# Patient Record
Sex: Female | Born: 1985 | Race: Black or African American | Hispanic: No | Marital: Single | Smoking: Never smoker
Health system: Southern US, Community
[De-identification: ages and names within clinical notes are randomized; demographics above are authoritative.]

---

## 2021-05-04 ENCOUNTER — Observation Stay (HOSPITAL_COMMUNITY): Payer: Medicaid Other

## 2021-05-04 ENCOUNTER — Emergency Department (HOSPITAL_COMMUNITY): Payer: Medicaid Other

## 2021-05-04 ENCOUNTER — Encounter (HOSPITAL_COMMUNITY): Payer: Self-pay | Admitting: Emergency Medicine

## 2021-05-04 ENCOUNTER — Inpatient Hospital Stay (HOSPITAL_COMMUNITY)
Admission: EM | Admit: 2021-05-04 | Discharge: 2021-05-08 | DRG: 446 | Disposition: A | Discharge status: Home / Self Care | Payer: Medicaid Other | Attending: Internal Medicine | Admitting: Internal Medicine

## 2021-05-04 ENCOUNTER — Other Ambulatory Visit: Payer: Self-pay

## 2021-05-04 DIAGNOSIS — Z20822 Contact with and (suspected) exposure to covid-19: Secondary | ICD-10-CM | POA: Diagnosis present

## 2021-05-04 DIAGNOSIS — R1013 Epigastric pain: Secondary | ICD-10-CM

## 2021-05-04 DIAGNOSIS — K8050 Calculus of bile duct without cholangitis or cholecystitis without obstruction: Secondary | ICD-10-CM

## 2021-05-04 DIAGNOSIS — R1012 Left upper quadrant pain: Secondary | ICD-10-CM

## 2021-05-04 DIAGNOSIS — Z9049 Acquired absence of other specified parts of digestive tract: Secondary | ICD-10-CM

## 2021-05-04 DIAGNOSIS — R109 Unspecified abdominal pain: Secondary | ICD-10-CM | POA: Diagnosis present

## 2021-05-04 DIAGNOSIS — Z6835 Body mass index (BMI) 35.0-35.9, adult: Secondary | ICD-10-CM

## 2021-05-04 DIAGNOSIS — F1210 Cannabis abuse, uncomplicated: Secondary | ICD-10-CM | POA: Diagnosis present

## 2021-05-04 LAB — COMPREHENSIVE METABOLIC PANEL
ALT: 71 U/L — ABNORMAL HIGH (ref 0–44)
AST: 155 U/L — ABNORMAL HIGH (ref 15–41)
Albumin: 4.1 g/dL (ref 3.5–5.0)
Alkaline Phosphatase: 93 U/L (ref 38–126)
Anion gap: 9 (ref 5–15)
BUN: 11 mg/dL (ref 6–20)
CO2: 21 mmol/L — ABNORMAL LOW (ref 22–32)
Calcium: 9.5 mg/dL (ref 8.9–10.3)
Chloride: 109 mmol/L (ref 98–111)
Creatinine, Ser: 0.67 mg/dL (ref 0.44–1.00)
GFR, Estimated: >60 mL/min (ref >60)
Glucose, Bld: 110 mg/dL — ABNORMAL HIGH (ref 70–99)
Potassium: 3.6 mmol/L (ref 3.5–5.1)
Sodium: 139 mmol/L (ref 135–145)
Total Bilirubin: 1.5 mg/dL — ABNORMAL HIGH (ref 0.3–1.2)
Total Protein: 8.1 g/dL (ref 6.5–8.1)

## 2021-05-04 LAB — URINALYSIS, ROUTINE W REFLEX MICROSCOPIC
Bacteria, UA: NONE SEEN
Bilirubin Urine: NEGATIVE
Glucose, UA: NEGATIVE mg/dL
Ketones, ur: NEGATIVE mg/dL
Leukocytes,Ua: NEGATIVE
Nitrite: NEGATIVE
Protein, ur: NEGATIVE mg/dL
Specific Gravity, Urine: 1.013 (ref 1.005–1.030)
pH: 8 (ref 5.0–8.0)

## 2021-05-04 LAB — CBC
HCT: 37.7 % (ref 36.0–46.0)
HCT: 38.5 % (ref 36.0–46.0)
Hemoglobin: 13.2 g/dL (ref 12.0–15.0)
Hemoglobin: 13.2 g/dL (ref 12.0–15.0)
MCH: 29.9 pg (ref 26.0–34.0)
MCH: 30.2 pg (ref 26.0–34.0)
MCHC: 35 g/dL (ref 30.0–36.0)
MCHC: 34.3 g/dL (ref 30.0–36.0)
MCV: 85.5 fL (ref 80.0–100.0)
MCV: 88.1 fL (ref 80.0–100.0)
Platelets: 318 10*3/uL (ref 150–400)
Platelets: 283 10*3/uL (ref 150–400)
RBC: 4.41 MIL/uL (ref 3.87–5.11)
RBC: 4.37 MIL/uL (ref 3.87–5.11)
RDW: 12 % (ref 11.5–15.5)
RDW: 11.9 % (ref 11.5–15.5)
WBC: 8.6 10*3/uL (ref 4.0–10.5)
WBC: 7.6 10*3/uL (ref 4.0–10.5)
nRBC: 0 % (ref 0.0–0.2)
nRBC: 0 % (ref 0.0–0.2)

## 2021-05-04 LAB — PREGNANCY, URINE: Preg Test, Ur: NEGATIVE

## 2021-05-04 LAB — CREATININE, SERUM
Creatinine, Ser: 0.81 mg/dL (ref 0.44–1.00)
GFR, Estimated: >60 mL/min (ref >60)

## 2021-05-04 LAB — HIV ANTIBODY (ROUTINE TESTING W REFLEX): HIV Screen 4th Generation wRfx: NONREACTIVE

## 2021-05-04 LAB — LIPASE, BLOOD: Lipase: 44 U/L (ref 11–51)

## 2021-05-04 LAB — SARS CORONAVIRUS 2 (TAT 6-24 HRS): SARS Coronavirus 2: NEGATIVE

## 2021-05-04 MED ORDER — LIDOCAINE VISCOUS HCL 2 % MT SOLN
15.0000 mL | Freq: Once | OROMUCOSAL | Status: AC
  Administered 2021-05-04: 15 mL via ORAL
  Filled 2021-05-04: qty 15

## 2021-05-04 MED ORDER — MORPHINE SULFATE (PF) 4 MG/ML IV SOLN
4.0000 mg | Freq: Once | INTRAVENOUS | Status: AC
  Administered 2021-05-04: 4 mg via INTRAVENOUS
  Filled 2021-05-04: qty 1

## 2021-05-04 MED ORDER — ACETAMINOPHEN 325 MG PO TABS
650.0000 mg | ORAL_TABLET | Freq: Four times a day (QID) | ORAL | Status: DC | PRN
  Administered 2021-05-06 – 2021-05-08 (×2): 650 mg via ORAL
  Filled 2021-05-04 (×2): qty 2

## 2021-05-04 MED ORDER — IOHEXOL 9 MG/ML PO SOLN
ORAL | Status: AC
  Filled 2021-05-04: qty 1000

## 2021-05-04 MED ORDER — SODIUM CHLORIDE 0.9 % IV SOLN: Freq: Once | INTRAVENOUS | Status: AC

## 2021-05-04 MED ORDER — ACETAMINOPHEN 650 MG RE SUPP: 650.0000 mg | Freq: Four times a day (QID) | RECTAL | Status: DC | PRN

## 2021-05-04 MED ORDER — ALUM & MAG HYDROXIDE-SIMETH 200-200-20 MG/5ML PO SUSP
30.0000 mL | Freq: Once | ORAL | Status: AC
  Administered 2021-05-04: 30 mL via ORAL
  Filled 2021-05-04: qty 30

## 2021-05-04 MED ORDER — OXYCODONE HCL 5 MG PO TABS
5.0000 mg | ORAL_TABLET | ORAL | Status: DC | PRN
  Administered 2021-05-05 – 2021-05-08 (×7): 5 mg via ORAL
  Filled 2021-05-04 (×7): qty 1

## 2021-05-04 MED ORDER — PROCHLORPERAZINE EDISYLATE 10 MG/2ML IJ SOLN
10.0000 mg | Freq: Four times a day (QID) | INTRAMUSCULAR | Status: DC | PRN
  Administered 2021-05-05 – 2021-05-06 (×2): 10 mg via INTRAVENOUS
  Filled 2021-05-04 (×2): qty 2

## 2021-05-04 MED ORDER — IOHEXOL 9 MG/ML PO SOLN: 500.0000 mL | ORAL | Status: AC

## 2021-05-04 MED ORDER — PANTOPRAZOLE SODIUM 40 MG IV SOLR
40.0000 mg | INTRAVENOUS | Status: DC
  Administered 2021-05-04 – 2021-05-06 (×3): 40 mg via INTRAVENOUS
  Filled 2021-05-04 (×3): qty 40

## 2021-05-04 MED ORDER — SODIUM CHLORIDE 0.9 % IV BOLUS
1000.0000 mL | Freq: Once | INTRAVENOUS | Status: AC
  Administered 2021-05-04: 1000 mL via INTRAVENOUS

## 2021-05-04 MED ORDER — MORPHINE SULFATE (PF) 4 MG/ML IV SOLN
4.0000 mg | INTRAVENOUS | Status: DC | PRN
  Administered 2021-05-04 – 2021-05-06 (×9): 4 mg via INTRAVENOUS
  Filled 2021-05-04 (×9): qty 1

## 2021-05-04 MED ORDER — HYOSCYAMINE SULFATE 0.125 MG SL SUBL
0.2500 mg | SUBLINGUAL_TABLET | Freq: Once | SUBLINGUAL | Status: AC
  Administered 2021-05-04: 0.25 mg via SUBLINGUAL
  Filled 2021-05-04: qty 2

## 2021-05-04 MED ORDER — GADOBUTROL 1 MMOL/ML IV SOLN
9.0000 mL | Freq: Once | INTRAVENOUS | Status: AC | PRN
  Administered 2021-05-04: 9 mL via INTRAVENOUS

## 2021-05-04 MED ORDER — METOCLOPRAMIDE HCL 5 MG/ML IJ SOLN
10.0000 mg | Freq: Once | INTRAMUSCULAR | Status: AC
  Administered 2021-05-04: 10 mg via INTRAVENOUS
  Filled 2021-05-04: qty 2

## 2021-05-04 MED ORDER — ENOXAPARIN SODIUM 40 MG/0.4ML IJ SOSY
40.0000 mg | PREFILLED_SYRINGE | INTRAMUSCULAR | Status: DC
  Administered 2021-05-05 – 2021-05-07 (×3): 40 mg via SUBCUTANEOUS
  Filled 2021-05-04 (×4): qty 0.4

## 2021-05-04 NOTE — ED Provider Notes (Signed)
[facility]-EMERGENCY DEPT Provider Note  CSN: [id number] Arrival date & time: 05/04/21 0138  Chief Complaint(s) Abdominal Pain  HPI Jessica Kirk is a 35 y.o. female here with 2 days of gradually worsening epigastric abdominal pain radiating to right upper quadrant and back.  She is endorsing associated nausea and nonbloody nonbilious emesis.  Pain worse with oral intake.  Reports inability to tolerate oral intake.  No alleviating factor.    Reports similar episode in the back approximately 15 years ago when she had gallstones.  Reports having her gallbladder removed in [place].  Denies any suspicious food intake.  No diarrhea.  Still having bowel movements.  Patient is currently on her menstrual cycle.  Denies any urinary symptoms.  Patient denies any daily alcohol use.  Denies any recent alcohol use.  Does admit to smoking marijuana and taking edibles.   HPI  Past Medical History History reviewed. No pertinent past medical history. There are no problems to display for this patient.  Home Medication(s) Prior to Admission medications   Not on File                                                                                                                                    Past Surgical History ** The histories are not reviewed yet. Please review them in the "History" navigator section and refresh this SmartLink. Family History History reviewed. No pertinent family history.  Social History   Allergies Patient has no known allergies.  Review of Systems Review of Systems All other systems are reviewed and are negative for acute change except as noted in the HPI  Physical Exam Vital Signs  I have reviewed the triage vital signs BP 122/86 (BP Location: Right Arm)   Pulse 68   Temp 98.7 F (37.1 C) (Oral)   Resp 18   Ht 5\' 4"  (1.626 m)   Wt 93 kg   SpO2 100%   BMI 35.19 kg/m   Physical Exam Vitals reviewed.  Constitutional:      General:  She is not in acute distress.    Appearance: She is well-developed. She is not diaphoretic.     Comments: uncomfortable  HENT:     Head: Normocephalic and atraumatic.     Right Ear: External ear normal.     Left Ear: External ear normal.     Nose: Nose normal.  Eyes:     General: No scleral icterus.    Conjunctiva/sclera: Conjunctivae normal.  Neck:     Trachea: Phonation normal.  Cardiovascular:     Rate and Rhythm: Normal rate and regular rhythm.  Pulmonary:     Effort: Pulmonary effort is normal. No respiratory distress.     Breath sounds: No stridor.  Abdominal:     General: There is no distension.     Tenderness: There is abdominal tenderness in the epigastric area, periumbilical area and left upper quadrant. There  is no guarding or rebound.  Musculoskeletal:        General: Normal range of motion.     Cervical back: Normal range of motion.  Neurological:     Mental Status: She is alert and oriented to person, place, and time.  Psychiatric:        Behavior: Behavior normal.     ED Results and Treatments Labs (all labs ordered are listed, but only abnormal results are displayed) Labs Reviewed  COMPREHENSIVE METABOLIC PANEL - Abnormal; Notable for the following components:      Result Value   CO2 21 (*)    Glucose, Bld 110 (*)    AST 155 (*)    ALT 71 (*)    Total Bilirubin 1.5 (*)    All other components within normal limits  URINALYSIS, ROUTINE W REFLEX MICROSCOPIC - Abnormal; Notable for the following components:   Hgb urine dipstick MODERATE (*)    All other components within normal limits  SARS CORONAVIRUS 2 (TAT 6-24 HRS)  LIPASE, BLOOD  CBC  PREGNANCY, URINE                                                                                                                         EKG  EKG Interpretation  Date/Time:    Ventricular Rate:    PR Interval:    QRS Duration:   QT Interval:    QTC Calculation:   R Axis:     Text Interpretation:         Radiology US Abdomen Limited RUQ (LIVER/GB)  Result Date: 05/04/2021 CLINICAL DATA:  Right upper quadrant pain EXAM: ULTRASOUND ABDOMEN LIMITED RIGHT UPPER QUADRANT COMPARISON:  None. FINDINGS: Gallbladder: Status post cholecystectomy. Small cystic area within the gallbladder measures 2.7 x 0.7 x 1.3 cm. No free fluid identified within the gallbladder fossa. Common bile duct: Diameter: 13.7 mm Liver: No focal lesion identified. Within normal limits in parenchymal echogenicity. Portal vein is patent on color Doppler imaging with normal direction of blood flow towards the liver. Other: None. IMPRESSION: 1. Dilated common bile duct status post cholecystectomy. If there are clinical signs or symptoms concerning for choledocholithiasis consider further evaluation with MRCP. 2. Small cystic structure within the gallbladder fossa may represent gallbladder or cystic duct remnant status post cholecystectomy. Electronically Signed   By: Signa Kell M.D.   On: 05/04/2021 05:20    Pertinent labs & imaging results that were available during my care of the patient were reviewed by me and considered in my medical decision making (see chart for details).  Medications Ordered in ED Medications  sodium chloride 0.9 % bolus 1,000 mL (0 mLs Intravenous Stopped 05/04/21 0519)  metoCLOPramide (REGLAN) injection 10 mg (10 mg Intravenous Given 05/04/21 0311)  alum & mag hydroxide-simeth (MAALOX/MYLANTA) 200-200-20 MG/5ML suspension 30 mL (30 mLs Oral Given 05/04/21 0311)    And  lidocaine (XYLOCAINE) 2 % viscous mouth solution 15 mL (15 mLs Oral Given 05/04/21 0311)  hyoscyamine (LEVSIN  SL) SL tablet 0.25 mg (0.25 mg Sublingual Given 05/04/21 0311)  morphine 4 MG/ML injection 4 mg (4 mg Intravenous Given 05/04/21 0458)                                                                                                                                    Procedures Procedures  (including critical care time)  Medical  Decision Making / ED Course I have reviewed the nursing notes for this encounter and the patient's prior records (if available in EHR or on provided paperwork).   [name] was evaluated in Emergency Department on 05/04/2021 for the symptoms described in the history of present illness. She was evaluated in the context of the global COVID-19 pandemic, which necessitated consideration that the patient might be at risk for infection with the SARS-CoV-2 virus that causes COVID-19. Institutional protocols and algorithms that pertain to the evaluation of patients at risk for COVID-19 are in a state of rapid change based on information released by regulatory bodies including the CDC and federal and state organizations. These policies and algorithms were followed during the patient's care in the ED.  Patient presents with abdominal pain with nausea and nonbloody nonbilious emesis. Labs grossly reassuring without leukocytosis or anemia.  No significant electrolyte derangements or renal sufficiency. Evidence of bili obstruction.  No pancreatitis. UA without evidence of infection.  Blood is likely from her menstrual cycle.  Doubt renal stone. Low suspicion for serious intra-abdominal inflammatory/infectious process or bowel obstruction requiring imaging at this time.  Patient treated symptomatically with antiemetics, GI cocktail and IV fluids.  RUQ [place] concerning for choledocholithiasis.  On reassessment patient still in pain.  Given a dose of IV morphine.  This did help.  Will call hospitalist for admission to trend LFTs and obtain an MRCP.      Final Clinical Impression(s) / ED Diagnoses Final diagnoses:  Epigastric abdominal pain      This chart was dictated using voice recognition software.  Despite best efforts to proofread,  errors can occur which can change the documentation meaning.   Nira Conn, MD 05/04/21 0700

## 2021-05-04 NOTE — ED Notes (Signed)
Pt given meal tray at this time 

## 2021-05-04 NOTE — H&P (Addendum)
History and Physical    Jessica Kirk ZDG:[id number] DOB: Sep 13, 1986 DOA: 05/04/2021  PCP: Pcp, No  Patient coming from: Home  Chief Complaint: stomach pain  HPI: Jessica Kirk is a 35 y.o. female with medical history significant of arthritis, marijuana use. Presenting with ab pain, N/V. Reports symptoms started 2 days ago with a non-radiating epigastric pain. She is unable to describe the pain, but it has been constant. She does not associate it with any particular activity. She tried gas medicines and laxatives; but they didn't help. She has had N/V to go along with this pain. She had been unable to keep anything down. Her symptoms worsened through last night, so she decided to come to the ED. She denies any other aggravating or alleviating factors.    Of note, her cholecystectomy was performed 15 years ago at [facility] in [place]. Initially it was laproscopic. However, she had some retained/loose stones that required an open revision several days later.    ED Course: She was given a GI cocktail. She had elevated LFTs. RUQ ab US obtained that showed dilated CBD. TRH called for admission.   Review of Systems:  Denies CP, palpitations, dyspnea, HA, diarrhea/constipation, hematochezia, hematemesis, syncopal episodes, fever. Review of systems is otherwise negative for all not mentioned in HPI.   PMHx Arthritis  PSHx Cholecystectomy Carpal tunnel release  SocHx Denies tobacco use Reports EtOH 3 times per month Reports daily mj use: "all day"  No Known Allergies  FamHx History reviewed. No pertinent family history.  Prior to Admission medications   Not on File    Physical Exam: Vitals:   05/04/21 0145 05/04/21 0402 05/04/21 0639  BP: (!) 147/81 (!) 137/92 122/86  Pulse: 84 64 68  Resp: 20 18 18   Temp: 98.4 F (36.9 C)  98.7 F (37.1 C)  TempSrc: Oral  Oral  SpO2: 100% 97% 100%  Weight: 93 kg    Height: 5\' 4"  (1.626 m)      General: 35 y.o. female  resting in bed in NAD Eyes: PERRL, normal sclera ENMT: Nares patent w/o discharge, orophaynx clear, dentition normal, ears w/o discharge/lesions/ulcers Neck: Supple, trachea midline Cardiovascular: RRR, +S1, S2, no m/g/r, equal pulses throughout Respiratory: CTABL, no w/r/r, normal WOB GI: BS+, ND, epigastric/LUQ/LLQ TTP, no masses noted, no organomegaly noted MSK: No e/c/c Skin: No rashes, bruises, ulcerations noted Neuro: A&O x 3, no focal deficits Psyc: Appropriate interaction and affect, calm/cooperative  Labs on Admission: I have personally reviewed following labs and imaging studies  CBC: Recent Labs  Lab 05/04/21 0230  WBC 8.6  HGB 13.2  HCT 37.7  MCV 85.5  PLT 318   Basic Metabolic Panel: Recent Labs  Lab 05/04/21 0230  NA 139  K 3.6  CL 109  CO2 21*  GLUCOSE 110*  BUN 11  CREATININE 0.67  CALCIUM 9.5   GFR: Estimated Creatinine Clearance: 108.5 mL/min (by C-G formula based on SCr of 0.67 mg/dL). Liver Function Tests: Recent Labs  Lab 05/04/21 0230  AST 155*  ALT 71*  ALKPHOS 93  BILITOT 1.5*  PROT 8.1  ALBUMIN 4.1   Recent Labs  Lab 05/04/21 0230  LIPASE 44   No results for input(s): AMMONIA in the last 168 hours. Coagulation Profile: No results for input(s): INR, PROTIME in the last 168 hours. Cardiac Enzymes: No results for input(s): CKTOTAL, CKMB, CKMBINDEX, TROPONINI in the last 168 hours. BNP (last 3 results) No results for input(s): PROBNP in the last 8760 hours. HbA1C:  No results for input(s): HGBA1C in the last 72 hours. CBG: No results for input(s): GLUCAP in the last 168 hours. Lipid Profile: No results for input(s): CHOL, HDL, LDLCALC, TRIG, CHOLHDL, LDLDIRECT in the last 72 hours. Thyroid Function Tests: No results for input(s): TSH, T4TOTAL, FREET4, T3FREE, THYROIDAB in the last 72 hours. Anemia Panel: No results for input(s): VITAMINB12, FOLATE, FERRITIN, TIBC, IRON, RETICCTPCT in the last 72 hours. Urine analysis:     Component Value Date/Time   COLORURINE YELLOW 05/04/2021 0153   APPEARANCEUR CLEAR 05/04/2021 0153   LABSPEC 1.013 05/04/2021 0153   PHURINE 8.0 05/04/2021 0153   GLUCOSEU NEGATIVE 05/04/2021 0153   HGBUR MODERATE (A) 05/04/2021 0153   BILIRUBINUR NEGATIVE 05/04/2021 0153   KETONESUR NEGATIVE 05/04/2021 0153   PROTEINUR NEGATIVE 05/04/2021 0153   NITRITE NEGATIVE 05/04/2021 0153   LEUKOCYTESUR NEGATIVE 05/04/2021 0153    Radiological Exams on Admission: US Abdomen Limited RUQ (LIVER/GB)  Result Date: 05/04/2021 CLINICAL DATA:  Right upper quadrant pain EXAM: ULTRASOUND ABDOMEN LIMITED RIGHT UPPER QUADRANT COMPARISON:  None. FINDINGS: Gallbladder: Status post cholecystectomy. Small cystic area within the gallbladder measures 2.7 x 0.7 x 1.3 cm. No free fluid identified within the gallbladder fossa. Common bile duct: Diameter: 13.7 mm Liver: No focal lesion identified. Within normal limits in parenchymal echogenicity. Portal vein is patent on color Doppler imaging with normal direction of blood flow towards the liver. Other: None. IMPRESSION: 1. Dilated common bile duct status post cholecystectomy. If there are clinical signs or symptoms concerning for choledocholithiasis consider further evaluation with MRCP. 2. Small cystic structure within the gallbladder fossa may represent gallbladder or cystic duct remnant status post cholecystectomy. Electronically Signed   By: Signa Kell M.D.   On: 05/04/2021 05:20   Assessment/Plan Abdominal pain Elevated LFTs N/V     - place in obs, med-surg     - [place] RUQ ab shows dilated CBD s/p cholecystectomy.      - her abdominal pain is more epigastric to LUQ, her t bili is mildly elevated at 1.5; let's check CT ab/pelvis     - anti-emetics, pain control, protonix, CLD (advance as tolerated)     - follow AM labs     - UPDATE: CT w/ choledocholithiasis and possible eroded clip; spoke with GI (Dr. Ewing Schlein); rec'd MRCP.   Marijuana abuse     - reports  daily use     - counsel against further use     - her N/V complaint may have a component of cyclical vomiting syndrome  DVT prophylaxis: lovenox  Code Status: FULL  Family Communication: None at bedside  Consults called: None  Status is: Observation  The patient remains OBS appropriate and will d/c before 2 midnights.  Dispo: The patient is from: Home              Anticipated d/c is to: Home              Patient currently is not medically stable to d/c.   Difficult to place patient No  Teddy Spike DO [facility]  If 7PM-7AM, please contact night-coverage [email or web address]  05/04/2021, 7:08 AM

## 2021-05-04 NOTE — ED Notes (Signed)
Patient unable to sit still due to discomfort. Unable to get vitals at this time.

## 2021-05-04 NOTE — Consult Note (Signed)
Reason for Consult: Probable CBD stones Referring Physician: Hospital team  Jessica Kirk is an 35 y.o. female.  HPI: Patient seen and examined and case discussed with the hospital team and Jessica Kirk hospital computer chart reviewed and she had a long and complicated gallbladder surgery x2 in [place] years ago and she does not remember an endoscopic procedure but does remember 2 different drainage tubes and them injecting 1 prior to removing and running dye through Jessica Kirk liver but she has not had any other obvious GI problems since and this occurred and 2007 and Jessica Kirk current pain increased over this weekend and over-the-counter medicine did not help and she presented to the emergency room and ultrasound CT and increased lab work were reviewed currently she is doing fine tolerating clear liquids and has no other complaints  History reviewed. No pertinent past medical history.  History reviewed. No pertinent surgical history.  History reviewed. No pertinent family history.  Social History:  has no history on file for tobacco use, alcohol use, and drug use.  Allergies: No Known Allergies  Medications: I have reviewed the patient's current medications.  Results for orders placed or performed during the hospital encounter of 05/04/21 (from the past 48 hour(s))  Urinalysis, Routine w reflex microscopic Urine, Clean Catch     Status: Abnormal   Collection Time: 05/04/21  1:53 AM  Result Value Ref Range   Color, Urine YELLOW YELLOW   APPearance CLEAR CLEAR   Specific Gravity, Urine 1.013 1.005 - 1.030   pH 8.0 5.0 - 8.0   Glucose, UA NEGATIVE NEGATIVE mg/dL   Hgb urine dipstick MODERATE (A) NEGATIVE   Bilirubin Urine NEGATIVE NEGATIVE   Ketones, ur NEGATIVE NEGATIVE mg/dL   Protein, ur NEGATIVE NEGATIVE mg/dL   Nitrite NEGATIVE NEGATIVE   Leukocytes,Ua NEGATIVE NEGATIVE   RBC / HPF 0-5 0 - 5 RBC/hpf   WBC, UA 0-5 0 - 5 WBC/hpf   Bacteria, UA NONE SEEN NONE SEEN   Squamous Epithelial / LPF 0-5  0 - 5   Mucus PRESENT     Comment: Performed at [facility], [address], [place], [place] [address]  Pregnancy, urine     Status: None   Collection Time: 05/04/21  1:53 AM  Result Value Ref Range   Preg Test, Ur NEGATIVE NEGATIVE    Comment:        THE SENSITIVITY OF THIS METHODOLOGY IS >20 mIU/mL. Performed at [facility], [address] [place], [place] [address]   Lipase, blood     Status: None   Collection Time: 05/04/21  2:30 AM  Result Value Ref Range   Lipase 44 11 - 51 U/L    Comment: Performed at [facility], [address] [place], [place] [address]  Comprehensive metabolic panel     Status: Abnormal   Collection Time: 05/04/21  2:30 AM  Result Value Ref Range   Sodium 139 135 - 145 mmol/L   Potassium 3.6 3.5 - 5.1 mmol/L   Chloride 109 98 - 111 mmol/L   CO2 21 (L) 22 - 32 mmol/L   Glucose, Bld 110 (H) 70 - 99 mg/dL    Comment: Glucose reference range applies only to samples taken after fasting for at least 8 hours.   BUN 11 6 - 20 mg/dL   Creatinine, Ser 3.79 0.44 - 1.00 mg/dL   Calcium 9.5 8.9 - 02.4 mg/dL   Total Protein 8.1 6.5 - 8.1 g/dL   Albumin  4.1 3.5 - 5.0 g/dL   AST 382 (H) 15 - 41 U/L   ALT 71 (H) 0 - 44 U/L   Alkaline Phosphatase 93 38 - 126 U/L   Total Bilirubin 1.5 (H) 0.3 - 1.2 mg/dL   GFR, Estimated >50 >53 mL/min    Comment: (NOTE) Calculated using the CKD-EPI Creatinine Equation (2021)    Anion gap 9 5 - 15    Comment: Performed at [facility], [address], [place], [place] [address]  CBC     Status: None   Collection Time: 05/04/21  2:30 AM  Result Value Ref Range   WBC 8.6 4.0 - 10.5 K/uL   RBC 4.41 3.87 - 5.11 MIL/uL   Hemoglobin 13.2 12.0 - 15.0 g/dL   HCT 41.9 37.9 - 02.4 %   MCV 85.5 80.0 - 100.0 fL   MCH 29.9 26.0 - 34.0 pg   MCHC 35.0 30.0 - 36.0 g/dL   RDW 09.7 35.3 - 29.9 %   Platelets 318 150 - 400 K/uL   nRBC 0.0 0.0 - 0.2 %     Comment: Performed at [facility], [address], [place], [place] [address]    CT ABDOMEN PELVIS WO CONTRAST  Result Date: 05/04/2021 CLINICAL DATA:  Nausea and vomiting due to marijuana use with epigastric pain EXAM: CT ABDOMEN AND PELVIS WITHOUT CONTRAST TECHNIQUE: Multidetector CT imaging of the abdomen and pelvis was performed following the standard protocol without IV contrast. COMPARISON:  Abdominal ultrasound from earlier today FINDINGS: Lower chest:  Atelectatic type opacity in the right lower lobe. Hepatobiliary: No focal liver abnormality.Cholecystectomy. Mildly dilated common bile duct based on prior. 2 high-density structures in the distal common bile duct, 1 elongated and suggestive of a clip and the other 3 mm and rounded, suggestive of stone. Pancreas: Unremarkable. Spleen: Unremarkable. Adrenals/Urinary Tract: Negative adrenals. No hydronephrosis or stone. Unremarkable bladder. Stomach/Bowel:  No obstruction. No appendicitis. Vascular/Lymphatic: No acute vascular abnormality. No mass or adenopathy. Reproductive:No pathologic findings. Other: No ascites or pneumoperitoneum. Musculoskeletal: No acute abnormalities. IMPRESSION: Two opaque structures in the distal common bile duct, 1 most consistent with a clip (presumably eroded into the lumen) and the other suggesting small stone. Additional more proximal calculi may be present. Electronically Signed   By: Marnee Spring M.D.   On: 05/04/2021 10:26   US Abdomen Limited RUQ (LIVER/GB)  Result Date: 05/04/2021 CLINICAL DATA:  Right upper quadrant pain EXAM: ULTRASOUND ABDOMEN LIMITED RIGHT UPPER QUADRANT COMPARISON:  None. FINDINGS: Gallbladder: Status post cholecystectomy. Small cystic area within the gallbladder measures 2.7 x 0.7 x 1.3 cm. No free fluid identified within the gallbladder fossa. Common bile duct: Diameter: 13.7 mm Liver: No focal lesion identified. Within normal limits in parenchymal echogenicity. Portal  vein is patent on color Doppler imaging with normal direction of blood flow towards the liver. Other: None. IMPRESSION: 1. Dilated common bile duct status post cholecystectomy. If there are clinical signs or symptoms concerning for choledocholithiasis consider further evaluation with MRCP. 2. Small cystic structure within the gallbladder fossa may represent gallbladder or cystic duct remnant status post cholecystectomy. Electronically Signed   By: Signa Kell M.D.   On: 05/04/2021 05:20    Review of Systems negative except above Blood pressure 122/79, pulse 63, temperature 98.7 F (37.1 C), temperature source Oral, resp. rate 16, height 5\' 4"  (1.626 m), weight 93 kg, SpO2 97 %. Physical Exam vital signs stable afebrile no acute distress abdomen is soft nontender ultrasound  CT and labs reviewed  Assessment/Plan: Probable CBD stones with concerns regarding previous staples Plan: Await MRCP although may be difficult to interpret with interference from staples and the risks benefits methods and success rate of ERCP stone extraction and possibly stenting and possibly even surgical options was discussed with the patient and will wait on above with further work-up and plans pending those findings  Danaria Larsen E 05/04/2021, 1:29 PM

## 2021-05-04 NOTE — ED Notes (Signed)
Family to bedside at this time.

## 2021-05-04 NOTE — ED Notes (Signed)
Patient provided with clear liquid diet tray at this time

## 2021-05-04 NOTE — ED Notes (Signed)
Patient transported to CT 

## 2021-05-04 NOTE — H&P (View-Only) (Signed)
Reason for Consult: Probable CBD stones Referring Physician: Hospital team  Jessica Kirk is an 35 y.o. female.  HPI: Patient seen and examined and case discussed with the hospital team and her hospital computer chart reviewed and she had a long and complicated gallbladder surgery x2 in [place] years ago and she does not remember an endoscopic procedure but does remember 2 different drainage tubes and them injecting 1 prior to removing and running dye through her liver but she has not had any other obvious GI problems since and this occurred and 2007 and her current pain increased over this weekend and over-the-counter medicine did not help and she presented to the emergency room and ultrasound CT and increased lab work were reviewed currently she is doing fine tolerating clear liquids and has no other complaints  History reviewed. No pertinent past medical history.  History reviewed. No pertinent surgical history.  History reviewed. No pertinent family history.  Social History:  has no history on file for tobacco use, alcohol use, and drug use.  Allergies: No Known Allergies  Medications: I have reviewed the patient's current medications.  Results for orders placed or performed during the hospital encounter of 05/04/21 (from the past 48 hour(s))  Urinalysis, Routine w reflex microscopic Urine, Clean Catch     Status: Abnormal   Collection Time: 05/04/21  1:53 AM  Result Value Ref Range   Color, Urine YELLOW YELLOW   APPearance CLEAR CLEAR   Specific Gravity, Urine 1.013 1.005 - 1.030   pH 8.0 5.0 - 8.0   Glucose, UA NEGATIVE NEGATIVE mg/dL   Hgb urine dipstick MODERATE (A) NEGATIVE   Bilirubin Urine NEGATIVE NEGATIVE   Ketones, ur NEGATIVE NEGATIVE mg/dL   Protein, ur NEGATIVE NEGATIVE mg/dL   Nitrite NEGATIVE NEGATIVE   Leukocytes,Ua NEGATIVE NEGATIVE   RBC / HPF 0-5 0 - 5 RBC/hpf   WBC, UA 0-5 0 - 5 WBC/hpf   Bacteria, UA NONE SEEN NONE SEEN   Squamous Epithelial / LPF 0-5  0 - 5   Mucus PRESENT     Comment: Performed at [facility], [address], [place], [place] [address]  Pregnancy, urine     Status: None   Collection Time: 05/04/21  1:53 AM  Result Value Ref Range   Preg Test, Ur NEGATIVE NEGATIVE    Comment:        THE SENSITIVITY OF THIS METHODOLOGY IS >20 mIU/mL. Performed at [facility], [address] [place], [place] [address]   Lipase, blood     Status: None   Collection Time: 05/04/21  2:30 AM  Result Value Ref Range   Lipase 44 11 - 51 U/L    Comment: Performed at [facility], [address] [place], [place] [address]  Comprehensive metabolic panel     Status: Abnormal   Collection Time: 05/04/21  2:30 AM  Result Value Ref Range   Sodium 139 135 - 145 mmol/L   Potassium 3.6 3.5 - 5.1 mmol/L   Chloride 109 98 - 111 mmol/L   CO2 21 (L) 22 - 32 mmol/L   Glucose, Bld 110 (H) 70 - 99 mg/dL    Comment: Glucose reference range applies only to samples taken after fasting for at least 8 hours.   BUN 11 6 - 20 mg/dL   Creatinine, Ser 3.79 0.44 - 1.00 mg/dL   Calcium 9.5 8.9 - 02.4 mg/dL   Total Protein 8.1 6.5 - 8.1 g/dL   Albumin  4.1 3.5 - 5.0 g/dL   AST 382 (H) 15 - 41 U/L   ALT 71 (H) 0 - 44 U/L   Alkaline Phosphatase 93 38 - 126 U/L   Total Bilirubin 1.5 (H) 0.3 - 1.2 mg/dL   GFR, Estimated >50 >53 mL/min    Comment: (NOTE) Calculated using the CKD-EPI Creatinine Equation (2021)    Anion gap 9 5 - 15    Comment: Performed at [facility], [address], [place], [place] [address]  CBC     Status: None   Collection Time: 05/04/21  2:30 AM  Result Value Ref Range   WBC 8.6 4.0 - 10.5 K/uL   RBC 4.41 3.87 - 5.11 MIL/uL   Hemoglobin 13.2 12.0 - 15.0 g/dL   HCT 41.9 37.9 - 02.4 %   MCV 85.5 80.0 - 100.0 fL   MCH 29.9 26.0 - 34.0 pg   MCHC 35.0 30.0 - 36.0 g/dL   RDW 09.7 35.3 - 29.9 %   Platelets 318 150 - 400 K/uL   nRBC 0.0 0.0 - 0.2 %     Comment: Performed at [facility], [address], [place], [place] [address]    CT ABDOMEN PELVIS WO CONTRAST  Result Date: 05/04/2021 CLINICAL DATA:  Nausea and vomiting due to marijuana use with epigastric pain EXAM: CT ABDOMEN AND PELVIS WITHOUT CONTRAST TECHNIQUE: Multidetector CT imaging of the abdomen and pelvis was performed following the standard protocol without IV contrast. COMPARISON:  Abdominal ultrasound from earlier today FINDINGS: Lower chest:  Atelectatic type opacity in the right lower lobe. Hepatobiliary: No focal liver abnormality.Cholecystectomy. Mildly dilated common bile duct based on prior. 2 high-density structures in the distal common bile duct, 1 elongated and suggestive of a clip and the other 3 mm and rounded, suggestive of stone. Pancreas: Unremarkable. Spleen: Unremarkable. Adrenals/Urinary Tract: Negative adrenals. No hydronephrosis or stone. Unremarkable bladder. Stomach/Bowel:  No obstruction. No appendicitis. Vascular/Lymphatic: No acute vascular abnormality. No mass or adenopathy. Reproductive:No pathologic findings. Other: No ascites or pneumoperitoneum. Musculoskeletal: No acute abnormalities. IMPRESSION: Two opaque structures in the distal common bile duct, 1 most consistent with a clip (presumably eroded into the lumen) and the other suggesting small stone. Additional more proximal calculi may be present. Electronically Signed   By: Marnee Spring M.D.   On: 05/04/2021 10:26   US Abdomen Limited RUQ (LIVER/GB)  Result Date: 05/04/2021 CLINICAL DATA:  Right upper quadrant pain EXAM: ULTRASOUND ABDOMEN LIMITED RIGHT UPPER QUADRANT COMPARISON:  None. FINDINGS: Gallbladder: Status post cholecystectomy. Small cystic area within the gallbladder measures 2.7 x 0.7 x 1.3 cm. No free fluid identified within the gallbladder fossa. Common bile duct: Diameter: 13.7 mm Liver: No focal lesion identified. Within normal limits in parenchymal echogenicity. Portal  vein is patent on color Doppler imaging with normal direction of blood flow towards the liver. Other: None. IMPRESSION: 1. Dilated common bile duct status post cholecystectomy. If there are clinical signs or symptoms concerning for choledocholithiasis consider further evaluation with MRCP. 2. Small cystic structure within the gallbladder fossa may represent gallbladder or cystic duct remnant status post cholecystectomy. Electronically Signed   By: Signa Kell M.D.   On: 05/04/2021 05:20    Review of Systems negative except above Blood pressure 122/79, pulse 63, temperature 98.7 F (37.1 C), temperature source Oral, resp. rate 16, height 5\' 4"  (1.626 m), weight 93 kg, SpO2 97 %. Physical Exam vital signs stable afebrile no acute distress abdomen is soft nontender ultrasound  CT and labs reviewed  Assessment/Plan: Probable CBD stones with concerns regarding previous staples Plan: Await MRCP although may be difficult to interpret with interference from staples and the risks benefits methods and success rate of ERCP stone extraction and possibly stenting and possibly even surgical options was discussed with the patient and will wait on above with further work-up and plans pending those findings  Jessica Kirk E 05/04/2021, 1:29 PM

## 2021-05-04 NOTE — ED Notes (Signed)
Patient transported to MRI 

## 2021-05-04 NOTE — ED Triage Notes (Addendum)
Pt reports epigastric and abdominal pain for 2 days. Endorses vomiting. States that last time she felt like this, she had her gallbladder removed.

## 2021-05-04 NOTE — ED Notes (Signed)
Patient back from MRI at this time 

## 2021-05-05 ENCOUNTER — Observation Stay (HOSPITAL_COMMUNITY): Payer: Medicaid Other | Admitting: Certified Registered"

## 2021-05-05 ENCOUNTER — Encounter (HOSPITAL_COMMUNITY): Payer: Self-pay | Admitting: Internal Medicine

## 2021-05-05 ENCOUNTER — Encounter (HOSPITAL_COMMUNITY)
Admission: EM | Disposition: A | Discharge status: Home / Self Care | Payer: Self-pay | Source: Home / Self Care | Attending: Internal Medicine

## 2021-05-05 ENCOUNTER — Observation Stay (HOSPITAL_COMMUNITY): Payer: Medicaid Other

## 2021-05-05 DIAGNOSIS — R1013 Epigastric pain: Secondary | ICD-10-CM

## 2021-05-05 DIAGNOSIS — Z20822 Contact with and (suspected) exposure to covid-19: Secondary | ICD-10-CM | POA: Diagnosis present

## 2021-05-05 DIAGNOSIS — K8050 Calculus of bile duct without cholangitis or cholecystitis without obstruction: Principal | ICD-10-CM

## 2021-05-05 DIAGNOSIS — Z9049 Acquired absence of other specified parts of digestive tract: Secondary | ICD-10-CM | POA: Diagnosis not present

## 2021-05-05 DIAGNOSIS — F1210 Cannabis abuse, uncomplicated: Secondary | ICD-10-CM | POA: Diagnosis present

## 2021-05-05 DIAGNOSIS — Z6835 Body mass index (BMI) 35.0-35.9, adult: Secondary | ICD-10-CM | POA: Diagnosis not present

## 2021-05-05 HISTORY — PX: SPHINCTEROTOMY: SHX5544

## 2021-05-05 HISTORY — PX: ERCP: SHX5425

## 2021-05-05 HISTORY — PX: REMOVAL OF STONES: SHX5545

## 2021-05-05 LAB — CBC WITH DIFFERENTIAL/PLATELET
Abs Immature Granulocytes: 0.01 10*3/uL (ref 0.00–0.07)
Basophils Absolute: 0 10*3/uL (ref 0.0–0.1)
Basophils Relative: 1 %
Eosinophils Absolute: 0.2 10*3/uL (ref 0.0–0.5)
Eosinophils Relative: 3 %
HCT: 34.4 % — ABNORMAL LOW (ref 36.0–46.0)
Hemoglobin: 11.7 g/dL — ABNORMAL LOW (ref 12.0–15.0)
Immature Granulocytes: 0 %
Lymphocytes Relative: 36 %
Lymphs Abs: 2.2 10*3/uL (ref 0.7–4.0)
MCH: 30.1 pg (ref 26.0–34.0)
MCHC: 34 g/dL (ref 30.0–36.0)
MCV: 88.4 fL (ref 80.0–100.0)
Monocytes Absolute: 0.5 10*3/uL (ref 0.1–1.0)
Monocytes Relative: 8 %
Neutro Abs: 3.3 10*3/uL (ref 1.7–7.7)
Neutrophils Relative %: 52 %
Platelets: 252 10*3/uL (ref 150–400)
RBC: 3.89 MIL/uL (ref 3.87–5.11)
RDW: 12 % (ref 11.5–15.5)
WBC: 6.3 10*3/uL (ref 4.0–10.5)
nRBC: 0 % (ref 0.0–0.2)

## 2021-05-05 LAB — COMPREHENSIVE METABOLIC PANEL
ALT: 294 U/L — ABNORMAL HIGH (ref 0–44)
AST: 208 U/L — ABNORMAL HIGH (ref 15–41)
Albumin: 3.4 g/dL — ABNORMAL LOW (ref 3.5–5.0)
Alkaline Phosphatase: 113 U/L (ref 38–126)
Anion gap: 6 (ref 5–15)
BUN: 7 mg/dL (ref 6–20)
CO2: 25 mmol/L (ref 22–32)
Calcium: 8.4 mg/dL — ABNORMAL LOW (ref 8.9–10.3)
Chloride: 108 mmol/L (ref 98–111)
Creatinine, Ser: 0.82 mg/dL (ref 0.44–1.00)
GFR, Estimated: >60 mL/min (ref >60)
Glucose, Bld: 89 mg/dL (ref 70–99)
Potassium: 3.6 mmol/L (ref 3.5–5.1)
Sodium: 139 mmol/L (ref 135–145)
Total Bilirubin: 1.2 mg/dL (ref 0.3–1.2)
Total Protein: 6.6 g/dL (ref 6.5–8.1)

## 2021-05-05 SURGERY — ERCP, WITH INTERVENTION IF INDICATED
Anesthesia: General

## 2021-05-05 MED ORDER — GLUCAGON HCL RDNA (DIAGNOSTIC) 1 MG IJ SOLR
INTRAMUSCULAR | Status: DC | PRN
  Administered 2021-05-05: .5 mg via INTRAVENOUS

## 2021-05-05 MED ORDER — SODIUM CHLORIDE 0.9 % IV SOLN: INTRAVENOUS | Status: AC

## 2021-05-05 MED ORDER — INDOMETHACIN 50 MG RE SUPP
RECTAL | Status: DC | PRN
  Administered 2021-05-05: 100 mg via RECTAL

## 2021-05-05 MED ORDER — SODIUM CHLORIDE 0.9 % IV BOLUS
1000.0000 mL | Freq: Once | INTRAVENOUS | Status: AC
  Administered 2021-05-05: 1000 mL via INTRAVENOUS

## 2021-05-05 MED ORDER — ONDANSETRON HCL 4 MG/2ML IJ SOLN
INTRAMUSCULAR | Status: DC | PRN
  Administered 2021-05-05: 4 mg via INTRAVENOUS

## 2021-05-05 MED ORDER — MIDAZOLAM HCL 5 MG/5ML IJ SOLN
INTRAMUSCULAR | Status: DC | PRN
  Administered 2021-05-05: 2 mg via INTRAVENOUS

## 2021-05-05 MED ORDER — FENTANYL CITRATE (PF) 100 MCG/2ML IJ SOLN
INTRAMUSCULAR | Status: DC | PRN
  Administered 2021-05-05 (×2): 50 ug via INTRAVENOUS

## 2021-05-05 MED ORDER — LACTATED RINGERS IV SOLN: INTRAVENOUS | Status: DC | PRN

## 2021-05-05 MED ORDER — LIDOCAINE 2% (20 MG/ML) 5 ML SYRINGE
INTRAMUSCULAR | Status: DC | PRN
  Administered 2021-05-05: 80 mg via INTRAVENOUS

## 2021-05-05 MED ORDER — CIPROFLOXACIN IN D5W 400 MG/200ML IV SOLN
INTRAVENOUS | Status: AC
  Filled 2021-05-05: qty 200

## 2021-05-05 MED ORDER — INDOMETHACIN 50 MG RE SUPP
100.0000 mg | Freq: Once | RECTAL | Status: DC
  Filled 2021-05-05: qty 2

## 2021-05-05 MED ORDER — DEXAMETHASONE SODIUM PHOSPHATE 10 MG/ML IJ SOLN
INTRAMUSCULAR | Status: DC | PRN
  Administered 2021-05-05: 10 mg via INTRAVENOUS

## 2021-05-05 MED ORDER — ROCURONIUM BROMIDE 10 MG/ML (PF) SYRINGE
PREFILLED_SYRINGE | INTRAVENOUS | Status: DC | PRN
  Administered 2021-05-05: 90 mg via INTRAVENOUS

## 2021-05-05 MED ORDER — PROPOFOL 10 MG/ML IV BOLUS
INTRAVENOUS | Status: DC | PRN
  Administered 2021-05-05: 200 mg via INTRAVENOUS

## 2021-05-05 MED ORDER — INDOMETHACIN 50 MG RE SUPP
RECTAL | Status: AC
  Filled 2021-05-05: qty 2

## 2021-05-05 MED ORDER — PROPOFOL 10 MG/ML IV BOLUS
INTRAVENOUS | Status: AC
  Filled 2021-05-05: qty 40

## 2021-05-05 MED ORDER — CIPROFLOXACIN IN D5W 400 MG/200ML IV SOLN
400.0000 mg | Freq: Once | INTRAVENOUS | Status: AC
  Administered 2021-05-05: 400 mg via INTRAVENOUS

## 2021-05-05 MED ORDER — GLUCAGON HCL RDNA (DIAGNOSTIC) 1 MG IJ SOLR
INTRAMUSCULAR | Status: AC
  Filled 2021-05-05: qty 1

## 2021-05-05 MED ORDER — FENTANYL CITRATE (PF) 100 MCG/2ML IJ SOLN
INTRAMUSCULAR | Status: AC
  Filled 2021-05-05: qty 2

## 2021-05-05 MED ORDER — MIDAZOLAM HCL 2 MG/2ML IJ SOLN
INTRAMUSCULAR | Status: AC
  Filled 2021-05-05: qty 2

## 2021-05-05 MED ORDER — SODIUM CHLORIDE 0.9 % IV SOLN
INTRAVENOUS | Status: DC | PRN
  Administered 2021-05-05: 40 mL

## 2021-05-05 NOTE — Interval H&P Note (Signed)
History and Physical Interval Note:  05/05/2021 12:32 PM  Jessica Kirk  has presented today for surgery, with the diagnosis of CBD stones.  The various methods of treatment have been discussed with the patient and family. After consideration of risks, benefits and other options for treatment, the patient has consented to  Procedure(s): ENDOSCOPIC RETROGRADE CHOLANGIOPANCREATOGRAPHY (ERCP) (N/A) as a surgical intervention.  The patient's history has been reviewed, patient examined, no change in status, stable for surgery.  I have reviewed the patient's chart and labs.  Questions were answered to the patient's satisfaction.     Roschelle Calandra M  Assessment:   Bile duct stone. Dilated bile duct. Elevated LFTs. Prior complicated gallbladder surgery ~ 15 years ago (elsewhere) Question of intraductal clips, not appreciated on MRCP.  Plan:   ERCP for anticipated biliary sphincterotomy and bile duct stone extraction. Risks (up to and including bleeding, infection, perforation, pancreatitis that can be complicated by infected necrosis and death), benefits (removal of stones, alleviating blockage, decreasing risk of cholangitis or choledocholithiasis-related pancreatitis), and alternatives (watchful waiting, percutaneous transhepatic cholangiography) of ERCP were explained to patient/family in detail and patient elects to proceed.

## 2021-05-05 NOTE — Transfer of Care (Signed)
Immediate Anesthesia Transfer of Care Note  Patient: Jessica Kirk  Procedure(s) Performed: ENDOSCOPIC RETROGRADE CHOLANGIOPANCREATOGRAPHY (ERCP) (N/A )  Patient Location: PACU  Anesthesia Type:MAC  Level of Consciousness: awake, alert , oriented and patient cooperative  Airway & Oxygen Therapy: Patient Spontanous Breathing and Patient connected to face mask oxygen  Post-op Assessment: Report given to RN and Post -op Vital signs reviewed and stable  Post vital signs: Reviewed and stable  Last Vitals:  Vitals Value Taken Time  BP 115/48 05/05/21 1341  Temp 36.6 C 05/05/21 1340  Pulse 79 05/05/21 1345  Resp 19 05/05/21 1345  SpO2 100 % 05/05/21 1345  Vitals shown include unvalidated device data.  Last Pain:  Vitals:   05/05/21 1340  TempSrc: Oral  PainSc: 0-No pain      Patients Stated Pain Goal: 2 (22/58/34 6219)  Complications: No complications documented.

## 2021-05-05 NOTE — Anesthesia Preprocedure Evaluation (Addendum)
Anesthesia Evaluation  Patient identified by MRN, date of birth, ID band Patient awake    Reviewed: Allergy & Precautions, NPO status , Patient's Chart, lab work & pertinent test results  Airway Mallampati: I  TM Distance: >3 FB Neck ROM: Full    Dental no notable dental hx. (+) Teeth Intact, Dental Advisory Given   Pulmonary neg pulmonary ROS,    Pulmonary exam normal breath sounds clear to auscultation       Cardiovascular negative cardio ROS Normal cardiovascular exam Rhythm:Regular Rate:Normal     Neuro/Psych negative neurological ROS  negative psych ROS   GI/Hepatic negative GI ROS, (+)     substance abuse  marijuana use,   Endo/Other  negative endocrine ROSObese BMI 35  Renal/GU negative Renal ROS  negative genitourinary   Musculoskeletal negative musculoskeletal ROS (+)   Abdominal   Peds  Hematology negative hematology ROS (+)   Anesthesia Other Findings ERCP for CBD stones  Reproductive/Obstetrics                            Anesthesia Physical Anesthesia Plan  ASA: II  Anesthesia Plan: General   Post-op Pain Management:    Induction: Intravenous  PONV Risk Score and Plan: 3 and Midazolam, Dexamethasone and Ondansetron  Airway Management Planned: Oral ETT  Additional Equipment:   Intra-op Plan:   Post-operative Plan: Extubation in OR  Informed Consent: I have reviewed the patients History and Physical, chart, labs and discussed the procedure including the risks, benefits and alternatives for the proposed anesthesia with the patient or authorized representative who has indicated his/her understanding and acceptance.     Dental advisory given  Plan Discussed with: CRNA  Anesthesia Plan Comments:         Anesthesia Quick Evaluation

## 2021-05-05 NOTE — Procedures (Signed)
Addendum:  There was NO evidence of bile leak (report recognition error).  Bile duct stones seen and removed after biliary sphincterotomy, but NO evidence of bile leak seen.

## 2021-05-05 NOTE — Anesthesia Procedure Notes (Signed)
Procedure Name: Intubation Performed by: Kemberly Taves, CRNA Pre-anesthesia Checklist: Patient identified, Emergency Drugs available, Suction available and Patient being monitored Patient Re-evaluated:Patient Re-evaluated prior to induction Oxygen Delivery Method: Circle system utilized Preoxygenation: Pre-oxygenation with 100% oxygen Induction Type: IV induction Ventilation: Mask ventilation without difficulty Laryngoscope Size: Mac and 3 Grade View: Grade I Tube type: Oral Tube size: 7.0 mm Number of attempts: 1 Airway Equipment and Method: Stylet and Oral airway Placement Confirmation: ETT inserted through vocal cords under direct vision,  positive ETCO2 and breath sounds checked- equal and bilateral Secured at: 21 cm Tube secured with: Tape Dental Injury: Teeth and Oropharynx as per pre-operative assessment        

## 2021-05-05 NOTE — Anesthesia Postprocedure Evaluation (Signed)
Anesthesia Post Note  Patient: Jessica Kirk  Procedure(s) Performed: ENDOSCOPIC RETROGRADE CHOLANGIOPANCREATOGRAPHY (ERCP) (N/A ) SPHINCTEROTOMY REMOVAL OF STONES     Patient location during evaluation: Endoscopy Anesthesia Type: General Level of consciousness: awake Pain management: pain level controlled Vital Signs Assessment: post-procedure vital signs reviewed and stable Respiratory status: spontaneous breathing Cardiovascular status: stable Postop Assessment: no apparent nausea or vomiting Anesthetic complications: no   No complications documented.  Last Vitals:  Vitals:   05/05/21 1350 05/05/21 1400  BP: 116/73 119/74  Pulse: 70 (!) 54  Resp: (!) 23 18  Temp:    SpO2: 100% 100%    Last Pain:  Vitals:   05/05/21 1400  TempSrc:   PainSc: 0-No pain                 John F Scharlene Corn

## 2021-05-05 NOTE — Progress Notes (Signed)
PROGRESS NOTE    Jessica Kirk  JKK:[id number] DOB: [birth year] DOA: 05/04/2021 PCP: Pcp, No (Confirm with patient/family/NH records and if not entered, this HAS to be entered at [facility] point of entry. "No PCP" if truly none.)   Chief Complaint  Patient presents with  . Abdominal Pain    Brief Narrative:  Patient is a 35 year old female history of arthritis presenting to the ED with abdominal pain, nausea vomiting which started 2 days prior to admission nonradiating and in the epigastric region.  Patient with prior history of cholecystectomy in 2007. Patient underwent right upper quadrant ultrasound which showed dilated common bile duct status postcholecystectomy.  CT abdomen and pelvis done consistent with choledocholithiasis and possibly eroded clip.  GI consulted and MRCP recommended which was done and concerning for intra and extrahepatic biliary duct dilatation with common bile duct measuring 1.2 cm, small calculus noted in the distal CBD, metallic susceptibility artifact near the ampulla. Patient for ERCP today 05/05/2021   Assessment & Plan:   Active Problems:   Abdominal pain  #1 epigastric/right upper quadrant abdominal pain secondary to choledocholithiasis -Patient history of cholecystectomy in 2007 presenting with sudden onset epigastric and right upper quadrant pain with associated nausea and vomiting. -Right upper quadrant ultrasound done with dilated common bile duct status postcholecystectomy. -CT abdomen and pelvis done consistent with choledocholithiasis and possible eroded clip. -MRCP done with intra and extrahepatic biliary duct dilatation with common bile duct measuring 1.2 cm, small calculus noted in the distal CBD, metallic susceptibility artifact at the ampulla. -LFTs trending up. -Patient currently on bowel rest. -Patient seen in consultation by GI and patient for ERCP for further evaluation and management. -Placed on IV fluids.  IV antiemetics.  Supportive  care. -Per GI.  2.  Marijuana abuse -Cessation stressed to patient.    DVT prophylaxis: SCDs Code Status: Full Family Communication: Updated patient.  No family at bedside. Disposition:   Status is: Observation    Dispo: The patient is from: Home              Anticipated d/c is to: Home              Patient currently with choledocholithiasis, requiring IV pain medication, awaiting ERCP for further evaluation and management.  Not stable for discharge.   Difficult to place patient no       Consultants:   Gastroenterology: Dr. Ewing Schlein 05/04/2021  Procedures:   CT abdomen and pelvis 05/04/2021  Right upper quadrant ultrasound 05/04/2021  MRCP 05/04/2021  Antimicrobials:   None   Subjective: Patient laying in bed.  States abdominal pain currently controlled on current pain medication.  Endorses nausea.  No further emesis.  No chest pain.  No shortness of breath.  Awaiting further evaluation with ERCP today.  Objective: Vitals:   05/04/21 1801 05/04/21 2205 05/05/21 0202 05/05/21 0608  BP: 120/68 108/90 99/61 109/72  Pulse: 60 (!) 56 (!) 56 61  Resp: 18 15 15 18   Temp: 98.3 F (36.8 C) 98.6 F (37 C) 97.9 F (36.6 C) 98.2 F (36.8 C)  TempSrc:      SpO2: 99% 100% 99% 100%  Weight:      Height:        Intake/Output Summary (Last 24 hours) at 05/05/2021 1156 Last data filed at 05/05/2021 0600 Gross per 24 hour  Intake 1040 ml  Output 0 ml  Net 1040 ml   Filed Weights   05/04/21 0145  Weight: 93 kg    Examination:  General exam: Appears calm and comfortable  Respiratory system: Clear to auscultation. Respiratory effort normal. Cardiovascular system: S1 & S2 heard, RRR. No JVD, murmurs, rubs, gallops or clicks. No pedal edema. Gastrointestinal system: Abdomen is nondistended, soft and tender to palpation in the epigastrium and right upper quadrant.  Positive bowel sounds.  No rebound.  No guarding. Central nervous system: Alert and oriented. No focal  neurological deficits. Extremities: Symmetric 5 x 5 power. Skin: No rashes, lesions or ulcers Psychiatry: Judgement and insight appear normal. Mood & affect appropriate.     Data Reviewed: I have personally reviewed following labs and imaging studies  CBC: Recent Labs  Lab 05/04/21 0230 05/04/21 1412 05/05/21 0313  WBC 8.6 7.6 6.3  NEUTROABS  --   --  3.3  HGB 13.2 13.2 11.7*  HCT 37.7 38.5 34.4*  MCV 85.5 88.1 88.4  PLT 318 283 252    Basic Metabolic Panel: Recent Labs  Lab 05/04/21 0230 05/04/21 1412 05/05/21 0313  NA 139  --  139  K 3.6  --  3.6  CL 109  --  108  CO2 21*  --  25  GLUCOSE 110*  --  89  BUN 11  --  7  CREATININE 0.67 0.81 0.82  CALCIUM 9.5  --  8.4*    GFR: Estimated Creatinine Clearance: 105.8 mL/min (by C-G formula based on SCr of 0.82 mg/dL).  Liver Function Tests: Recent Labs  Lab 05/04/21 0230 05/05/21 0313  AST 155* 208*  ALT 71* 294*  ALKPHOS 93 113  BILITOT 1.5* 1.2  PROT 8.1 6.6  ALBUMIN 4.1 3.4*    CBG: No results for input(s): GLUCAP in the last 168 hours.   Recent Results (from the past 240 hour(s))  SARS CORONAVIRUS 2 (TAT 6-24 HRS) Nasopharyngeal Nasopharyngeal Swab     Status: None   Collection Time: 05/04/21  7:02 AM   Specimen: Nasopharyngeal Swab  Result Value Ref Range Status   SARS Coronavirus 2 NEGATIVE NEGATIVE Final    Comment: (NOTE) SARS-CoV-2 target nucleic acids are NOT DETECTED.  The SARS-CoV-2 RNA is generally detectable in upper and lower respiratory specimens during the acute phase of infection. Negative results do not preclude SARS-CoV-2 infection, do not rule out co-infections with other pathogens, and should not be used as the sole basis for treatment or other patient management decisions. Negative results must be combined with clinical observations, patient history, and epidemiological information. The expected result is Negative.  Fact Sheet for  Patients: HairSlick.nohttps://www.fda.gov/media/138098/download  Fact Sheet for Healthcare Providers: quierodirigir.comhttps://www.fda.gov/media/138095/download  This test is not yet approved or cleared by the [place] FDA and  has been authorized for detection and/or diagnosis of SARS-CoV-2 by FDA under an Emergency Use Authorization (EUA). This EUA will remain  in effect (meaning this test can be used) for the duration of the COVID-19 declaration under Se ction 564(b)(1) of the Act, 21 U.S.C. section 360bbb-3(b)(1), unless the authorization is terminated or revoked sooner.  Performed at [facility], [address] [place], [place] [address]          Radiology Studies: CT ABDOMEN PELVIS WO CONTRAST  Result Date: 05/04/2021 CLINICAL DATA:  Nausea and vomiting due to marijuana use with epigastric pain EXAM: CT ABDOMEN AND PELVIS WITHOUT CONTRAST TECHNIQUE: Multidetector CT imaging of the abdomen and pelvis was performed following the standard protocol without IV contrast. COMPARISON:  Abdominal ultrasound from earlier today FINDINGS: Lower chest:  Atelectatic type opacity in the right lower lobe.  Hepatobiliary: No focal liver abnormality.Cholecystectomy. Mildly dilated common bile duct based on prior. 2 high-density structures in the distal common bile duct, 1 elongated and suggestive of a clip and the other 3 mm and rounded, suggestive of stone. Pancreas: Unremarkable. Spleen: Unremarkable. Adrenals/Urinary Tract: Negative adrenals. No hydronephrosis or stone. Unremarkable bladder. Stomach/Bowel:  No obstruction. No appendicitis. Vascular/Lymphatic: No acute vascular abnormality. No mass or adenopathy. Reproductive:No pathologic findings. Other: No ascites or pneumoperitoneum. Musculoskeletal: No acute abnormalities. IMPRESSION: Two opaque structures in the distal common bile duct, 1 most consistent with a clip (presumably eroded into the lumen) and the other suggesting small stone. Additional more  proximal calculi may be present. Electronically Signed   By: Marnee Spring M.D.   On: 05/04/2021 10:26   MR 3D Recon At Scanner  Result Date: 05/04/2021 CLINICAL DATA:  Choledocholithiasis, abdominal pain EXAM: MRI ABDOMEN WITHOUT AND WITH CONTRAST (INCLUDING MRCP) TECHNIQUE: Multiplanar multisequence MR imaging of the abdomen was performed both before and after the administration of intravenous contrast. Heavily T2-weighted images of the biliary and pancreatic ducts were obtained, and three-dimensional MRCP images were rendered by post processing. CONTRAST:  78mL GADAVIST GADOBUTROL 1 MMOL/ML IV SOLN COMPARISON:  Same day CT abdomen pelvis FINDINGS: Lower chest: No acute findings. Hepatobiliary: No mass or other parenchymal abnormality identified. Status post cholecystectomy. There is intra and extrahepatic biliary ductal dilatation, the common bile duct measuring up to 1.2 cm in caliber. There is metallic susceptibility artifact near the ampulla, in keeping with observation of the metallic clip on prior CT. There is a small calculus in the distal common bile duct measuring no greater than 3-4 mm (series 9, image 41). Pancreas: No mass, inflammatory changes, or other parenchymal abnormality identified. Spleen:  Within normal limits in size and appearance. Adrenals/Urinary Tract: No masses identified. No evidence of hydronephrosis. Stomach/Bowel: Visualized portions within the abdomen are unremarkable. Vascular/Lymphatic: No pathologically enlarged lymph nodes identified. No abdominal aortic aneurysm demonstrated. Other:  None. Musculoskeletal: No suspicious bone lesions identified. IMPRESSION: 1. There is intra and extrahepatic biliary ductal dilatation, the common bile duct measuring up to 1.2 cm in caliber. 2. There is a small calculus in the distal common bile duct measuring no greater than 3-4 mm. 3. There is metallic susceptibility artifact near the ampulla, in keeping with observation of the metallic  clip on prior CT. Electronically Signed   By: Lauralyn Primes M.D.   On: 05/04/2021 13:41   MR ABDOMEN MRCP W WO CONTAST  Result Date: 05/04/2021 CLINICAL DATA:  Choledocholithiasis, abdominal pain EXAM: MRI ABDOMEN WITHOUT AND WITH CONTRAST (INCLUDING MRCP) TECHNIQUE: Multiplanar multisequence MR imaging of the abdomen was performed both before and after the administration of intravenous contrast. Heavily T2-weighted images of the biliary and pancreatic ducts were obtained, and three-dimensional MRCP images were rendered by post processing. CONTRAST:  33mL GADAVIST GADOBUTROL 1 MMOL/ML IV SOLN COMPARISON:  Same day CT abdomen pelvis FINDINGS: Lower chest: No acute findings. Hepatobiliary: No mass or other parenchymal abnormality identified. Status post cholecystectomy. There is intra and extrahepatic biliary ductal dilatation, the common bile duct measuring up to 1.2 cm in caliber. There is metallic susceptibility artifact near the ampulla, in keeping with observation of the metallic clip on prior CT. There is a small calculus in the distal common bile duct measuring no greater than 3-4 mm (series 9, image 41). Pancreas: No mass, inflammatory changes, or other parenchymal abnormality identified. Spleen:  Within normal limits in size and appearance. Adrenals/Urinary Tract: No masses identified. No  evidence of hydronephrosis. Stomach/Bowel: Visualized portions within the abdomen are unremarkable. Vascular/Lymphatic: No pathologically enlarged lymph nodes identified. No abdominal aortic aneurysm demonstrated. Other:  None. Musculoskeletal: No suspicious bone lesions identified. IMPRESSION: 1. There is intra and extrahepatic biliary ductal dilatation, the common bile duct measuring up to 1.2 cm in caliber. 2. There is a small calculus in the distal common bile duct measuring no greater than 3-4 mm. 3. There is metallic susceptibility artifact near the ampulla, in keeping with observation of the metallic clip on prior  CT. Electronically Signed   By: Lauralyn Primes M.D.   On: 05/04/2021 13:41   US Abdomen Limited RUQ (LIVER/GB)  Result Date: 05/04/2021 CLINICAL DATA:  Right upper quadrant pain EXAM: ULTRASOUND ABDOMEN LIMITED RIGHT UPPER QUADRANT COMPARISON:  None. FINDINGS: Gallbladder: Status post cholecystectomy. Small cystic area within the gallbladder measures 2.7 x 0.7 x 1.3 cm. No free fluid identified within the gallbladder fossa. Common bile duct: Diameter: 13.7 mm Liver: No focal lesion identified. Within normal limits in parenchymal echogenicity. Portal vein is patent on color Doppler imaging with normal direction of blood flow towards the liver. Other: None. IMPRESSION: 1. Dilated common bile duct status post cholecystectomy. If there are clinical signs or symptoms concerning for choledocholithiasis consider further evaluation with MRCP. 2. Small cystic structure within the gallbladder fossa may represent gallbladder or cystic duct remnant status post cholecystectomy. Electronically Signed   By: Signa Kell M.D.   On: 05/04/2021 05:20        Scheduled Meds: . enoxaparin (LOVENOX) injection  40 mg Subcutaneous Q24H  . pantoprazole (PROTONIX) IV  40 mg Intravenous Q24H   Continuous Infusions: . sodium chloride       LOS: 0 days    Time spent: 40 minutes    Ramiro Harvest, MD [facility]   To contact the attending provider between 7A-7P or the covering provider during after hours 7P-7A, please log into the web site [email or web address] and access using universal [place] password for that web site. If you do not have the password, please call the hospital operator.  05/05/2021, 11:56 AM

## 2021-05-05 NOTE — Op Note (Addendum)
[facility] Patient Name: Jessica Kirk Procedure Date: 05/05/2021 MRN: [id number] Attending MD: Jessica Kirk , MD Date of Birth: August 01, 1986 CSN: [id number] Age: 35 Admit Type: Inpatient Procedure:                ERCP Indications:              Common bile duct stone(s), Abdominal pain of                            suspected biliary origin, Elevated liver enzymes Providers:                Jessica Modena, MD, Arlee Muslim Tech., Technician Referring MD:             [facility] Medicines:                General Anesthesia, Cipro 400 mg IV, Indomethacin                            100 mg PR Complications:            No immediate complications. Estimated Blood Loss:     Estimated blood loss: none. Procedure:                Pre-Anesthesia Assessment:                           - Prior to the procedure, a History and Physical                            was performed, and patient medications and                            allergies were reviewed. The patient's tolerance of                            previous anesthesia was also reviewed. The risks                            and benefits of the procedure and the sedation                            options and risks were discussed with the patient.                            All questions were answered, and informed consent                            was obtained. Prior Anticoagulants: The patient has                            taken no previous anticoagulant or antiplatelet                            agents. ASA Grade Assessment: II - A patient with  mild systemic disease. After reviewing the risks                            and benefits, the patient was deemed in                            satisfactory condition to undergo the procedure.                           After obtaining informed consent, the scope was                            passed under direct vision. Throughout the                             procedure, the patient's blood pressure, pulse, and                            oxygen saturations were monitored continuously. The                            Olympus TJF-Q180V [id number]) was introduced through                            the mouth, and used to inject contrast into and                            used to cannulate the bile duct. The ERCP was                            accomplished without difficulty. The patient                            tolerated the procedure well. Scope In: Scope Out: Findings:      A scout film of the abdomen was obtained. Surgical clips, consistent       with a previous cholecystectomy, were seen in the area of the right       upper quadrant of the abdomen. The esophagus was successfully intubated       under direct vision. The scope was advanced to a normal major papilla in       the descending duodenum without detailed examination of the pharynx,       larynx and associated structures, and upper GI tract. The upper GI tract       was grossly normal. The bile duct was deeply cannulated with the       short-nosed traction sphincterotome. Contrast was injected. I personally       interpreted the bile duct images. Ductal flow of contrast was adequate.       The lower third of the main bile duct contained two stones, the largest       of which was 5 mm in diameter. Extravasation of contrast originating       from the in the biliary system was observed. A 6 mm biliary       sphincterotomy was made with a traction (standard) sphincterotome using  blended current. There was no post-sphincterotomy bleeding. To discover       objects, the biliary tree was swept with a 15 mm balloon starting at the       bifurcation. Two stones were removed. No stones remained. Pancreatogram       was not obtained (intentionally). Impression:               - A bile leak was found.                           - Choledocholithiasis was found. Complete removal                             was accomplished by biliary sphincterotomy and                            balloon extraction.                           - A biliary sphincterotomy was performed.                           - The biliary tree was swept. Moderate Sedation:      Not Applicable - Patient had care per Anesthesia. Recommendation:           - Avoid aspirin and nonsteroidal anti-inflammatory                            medicines for 3 days.                           - Continue present medications.                           - Clear liquid diet today.                           [facility] will follow. Procedure Code(s):        --- Professional ---                           [phone], Endoscopic retrograde                            cholangiopancreatography (ERCP); with removal of                            calculi/debris from biliary/pancreatic duct(s)                           43262, Endoscopic retrograde                            cholangiopancreatography (ERCP); with                            sphincterotomy/papillotomy Diagnosis Code(s):        --- Professional ---  K83.9, Disease of biliary tract, unspecified                           K80.50, Calculus of bile duct without cholangitis                            or cholecystitis without obstruction                           R10.9, Unspecified abdominal pain                           R74.8, Abnormal levels of other serum enzymes CPT copyright 2019 American Medical Association. All rights reserved. The codes documented in this report are preliminary and upon coder review may  be revised to meet current compliance requirements. Jessica ModenaWilliam Emalee Knies, MD 05/05/2021 1:47:24 PM This report has been signed electronically. Number of Addenda: 0

## 2021-05-06 ENCOUNTER — Encounter (HOSPITAL_COMMUNITY): Payer: Self-pay | Admitting: Gastroenterology

## 2021-05-06 LAB — CBC WITH DIFFERENTIAL/PLATELET
Abs Immature Granulocytes: 0.03 10*3/uL (ref 0.00–0.07)
Basophils Absolute: 0 10*3/uL (ref 0.0–0.1)
Basophils Relative: 0 %
Eosinophils Absolute: 0 10*3/uL (ref 0.0–0.5)
Eosinophils Relative: 0 %
HCT: 37.2 % (ref 36.0–46.0)
Hemoglobin: 12.5 g/dL (ref 12.0–15.0)
Immature Granulocytes: 0 %
Lymphocytes Relative: 16 %
Lymphs Abs: 1.4 10*3/uL (ref 0.7–4.0)
MCH: 29.8 pg (ref 26.0–34.0)
MCHC: 33.6 g/dL (ref 30.0–36.0)
MCV: 88.8 fL (ref 80.0–100.0)
Monocytes Absolute: 0.6 10*3/uL (ref 0.1–1.0)
Monocytes Relative: 7 %
Neutro Abs: 6.8 10*3/uL (ref 1.7–7.7)
Neutrophils Relative %: 77 %
Platelets: 254 10*3/uL (ref 150–400)
RBC: 4.19 MIL/uL (ref 3.87–5.11)
RDW: 11.9 % (ref 11.5–15.5)
WBC: 8.8 10*3/uL (ref 4.0–10.5)
nRBC: 0 % (ref 0.0–0.2)

## 2021-05-06 LAB — COMPREHENSIVE METABOLIC PANEL
ALT: 249 U/L — ABNORMAL HIGH (ref 0–44)
AST: 115 U/L — ABNORMAL HIGH (ref 15–41)
Albumin: 3.4 g/dL — ABNORMAL LOW (ref 3.5–5.0)
Alkaline Phosphatase: 124 U/L (ref 38–126)
Anion gap: 7 (ref 5–15)
BUN: 7 mg/dL (ref 6–20)
CO2: 20 mmol/L — ABNORMAL LOW (ref 22–32)
Calcium: 8.5 mg/dL — ABNORMAL LOW (ref 8.9–10.3)
Chloride: 110 mmol/L (ref 98–111)
Creatinine, Ser: 0.77 mg/dL (ref 0.44–1.00)
GFR, Estimated: >60 mL/min (ref >60)
Glucose, Bld: 89 mg/dL (ref 70–99)
Potassium: 4.1 mmol/L (ref 3.5–5.1)
Sodium: 137 mmol/L (ref 135–145)
Total Bilirubin: 1 mg/dL (ref 0.3–1.2)
Total Protein: 6.7 g/dL (ref 6.5–8.1)

## 2021-05-06 LAB — MAGNESIUM: Magnesium: 2 mg/dL (ref 1.7–2.4)

## 2021-05-06 MED ORDER — PANTOPRAZOLE SODIUM 40 MG PO TBEC
40.0000 mg | DELAYED_RELEASE_TABLET | Freq: Every day | ORAL | Status: DC
  Administered 2021-05-07 – 2021-05-08 (×2): 40 mg via ORAL
  Filled 2021-05-06 (×2): qty 1

## 2021-05-06 NOTE — Progress Notes (Signed)
Subjective: Mild abdominal pain. Tolerating diet.  Objective: Vital signs in last 24 hours: Temp:  [97.9 F (36.6 C)-98.7 F (37.1 C)] 98.2 F (36.8 C) (05/17 0555) Pulse Rate:  [51-80] 57 (05/17 0555) Resp:  [14-23] 18 (05/17 0555) BP: (102-142)/(48-91) 102/58 (05/17 0555) SpO2:  [99 %-100 %] 99 % (05/17 0555) Weight change:  Last BM Date: 05/05/21  PE: GEN:  NAD ABD:  Mild epigastric tenderness without peritonitis  Lab Results: CBC    Component Value Date/Time   WBC 8.8 05/06/2021 0414   RBC 4.19 05/06/2021 0414   HGB 12.5 05/06/2021 0414   HCT 37.2 05/06/2021 0414   PLT 254 05/06/2021 0414   MCV 88.8 05/06/2021 0414   MCH 29.8 05/06/2021 0414   MCHC 33.6 05/06/2021 0414   RDW 11.9 05/06/2021 0414   LYMPHSABS 1.4 05/06/2021 0414   MONOABS 0.6 05/06/2021 0414   EOSABS 0.0 05/06/2021 0414   BASOSABS 0.0 05/06/2021 0414   CMP     Component Value Date/Time   NA 137 05/06/2021 0414   K 4.1 05/06/2021 0414   CL 110 05/06/2021 0414   CO2 20 (L) 05/06/2021 0414   GLUCOSE 89 05/06/2021 0414   BUN 7 05/06/2021 0414   CREATININE 0.77 05/06/2021 0414   CALCIUM 8.5 (L) 05/06/2021 0414   PROT 6.7 05/06/2021 0414   ALBUMIN 3.4 (L) 05/06/2021 0414   AST 115 (H) 05/06/2021 0414   ALT 249 (H) 05/06/2021 0414   ALKPHOS 124 05/06/2021 0414   BILITOT 1.0 05/06/2021 0414   GFRNONAA >60 05/06/2021 0414   Assessment:  1.  Abdominal pain. 2.  Elevated LFTs, downtrending. 3.  CBD stones, removed with ERCP yesterday.  Plan:  1.  Advance diet as tolerated. 2.  [facility] will follow.  Hopefully home in the next 1-2 days.   Freddy Jaksch 05/06/2021, 10:30 AM   Cell [phone] If no answer or after 5 PM call [phone]

## 2021-05-06 NOTE — Progress Notes (Signed)
Pharmacy IV to PO conversion  The patient is receiving pantoprazole by the intravenous route.  Based on criteria approved by the Pharmacy and Therapeutics Committee and the Medical Executive Committee, the medication is being converted to the equivalent oral dose form.   No active GI bleeding or impaired absorption  Not s/p esophagectomy  Documented ability to take oral medications for > 24 hr  Plan to continue treatment for at least 1 day  If you have any questions about this conversion, please contact the Pharmacy Department (ext [phone]).  Thank you.  Bernadene Person, PharmD, BCPS [phone] 05/06/2021, 10:49 AM

## 2021-05-06 NOTE — Progress Notes (Signed)
PROGRESS NOTE    Jessica Kirk  GYF:[id number] DOB: 30-Mar-1986 DOA: 05/04/2021 PCP: Pcp, No    Chief Complaint  Patient presents with  . Abdominal Pain    Brief Narrative:  Patient is a 35 year old female history of arthritis presenting to the ED with abdominal pain, nausea vomiting which started 2 days prior to admission nonradiating and in the epigastric region.  Patient with prior history of cholecystectomy in 2007. Patient underwent right upper quadrant ultrasound which showed dilated common bile duct status postcholecystectomy.  CT abdomen and pelvis done consistent with choledocholithiasis and possibly eroded clip.  GI consulted and MRCP recommended which was done and concerning for intra and extrahepatic biliary duct dilatation with common bile duct measuring 1.2 cm, small calculus noted in the distal CBD, metallic susceptibility artifact near the ampulla. Patient for ERCP today 05/05/2021   Assessment & Plan:   Active Problems:   Abdominal pain   Choledocholithiasis  #1 epigastric/right upper quadrant abdominal pain secondary to choledocholithiasis -Patient history of cholecystectomy in 2007 presenting with sudden onset epigastric and right upper quadrant pain with associated nausea and vomiting. -Right upper quadrant ultrasound done with dilated common bile duct status postcholecystectomy. -CT abdomen and pelvis done consistent with choledocholithiasis and possible eroded clip. -MRCP done with intra and extrahepatic biliary duct dilatation with common bile duct measuring 1.2 cm, small calculus noted in the distal CBD, metallic susceptibility artifact at the ampulla. -LFTs were trending up, now trending back down.   - s/p ERCP with CBD stone removal per Dr. Dulce Sellar 05/05/2021.   -Diet being advanced to full liquid diet today.   -IV antiemetics, pain management, supportive care.   -Per GI.   2.  Marijuana abuse -Cessation stressed to patient.    DVT prophylaxis: SCDs Code  Status: Full Family Communication: Updated patient.  No family at bedside. Disposition:   Status is: Observation    Dispo: The patient is from: Home              Anticipated d/c is to: Home              Patient currently with choledocholithiasis, requiring IV pain medication, status post ERCP diet being slowly advanced.  Not stable for discharge.   Difficult to place patient no       Consultants:   Gastroenterology: Dr. Ewing Schlein 05/04/2021  Procedures:   CT abdomen and pelvis 05/04/2021  Right upper quadrant ultrasound 05/04/2021  MRCP 05/04/2021  Antimicrobials:   None   Subjective: Patient laying in bed.  Tolerated clears.  Started on full liquid diet this morning.  Some nausea but no emesis.  Feels slight improvement with abdominal pain.  Overall feeling better.  No chest pain.  No shortness of breath.    Objective: Vitals:   05/05/21 1400 05/05/21 1425 05/05/21 2125 05/06/21 0555  BP: 119/74 109/88 (!) 142/91 (!) 102/58  Pulse: (!) 54 (!) 51 68 (!) 57  Resp: 18 18 18 18   Temp:  98.2 F (36.8 C) 98.7 F (37.1 C) 98.2 F (36.8 C)  TempSrc:   Oral Oral  SpO2: 100% 99% 100% 99%  Weight:      Height:        Intake/Output Summary (Last 24 hours) at 05/06/2021 1134 Last data filed at 05/06/2021 1000 Gross per 24 hour  Intake 5155.15 ml  Output 1 ml  Net 5154.15 ml   Filed Weights   05/04/21 0145  Weight: 93 kg    Examination:  General exam: NAD Respiratory  system: Lungs clear to auscultation bilaterally.  No wheezes, no crackles, no rhonchi.  Normal respiratory effort Cardiovascular system: Regular rate rhythm no murmurs rubs or gallops.  No JVD.  No lower extremity edema.  Gastrointestinal system: Abdomen is nondistended, soft and decreased tenderness to palpation in the epigastrium and right upper quadrant.  Some left upper quadrant abdominal pain.  Positive bowel sounds.  No rebound.  No guarding.   Central nervous system: Alert and oriented. No focal  neurological deficits. Extremities: Symmetric 5 x 5 power. Skin: No rashes, lesions or ulcers Psychiatry: Judgement and insight appear normal. Mood & affect appropriate.     Data Reviewed: I have personally reviewed following labs and imaging studies  CBC: Recent Labs  Lab 05/04/21 0230 05/04/21 1412 05/05/21 0313 05/06/21 0414  WBC 8.6 7.6 6.3 8.8  NEUTROABS  --   --  3.3 6.8  HGB 13.2 13.2 11.7* 12.5  HCT 37.7 38.5 34.4* 37.2  MCV 85.5 88.1 88.4 88.8  PLT 318 283 252 254    Basic Metabolic Panel: Recent Labs  Lab 05/04/21 0230 05/04/21 1412 05/05/21 0313 05/06/21 0414  NA 139  --  139 137  K 3.6  --  3.6 4.1  CL 109  --  108 110  CO2 21*  --  25 20*  GLUCOSE 110*  --  89 89  BUN 11  --  7 7  CREATININE 0.67 0.81 0.82 0.77  CALCIUM 9.5  --  8.4* 8.5*  MG  --   --   --  2.0    GFR: Estimated Creatinine Clearance: 108.5 mL/min (by C-G formula based on SCr of 0.77 mg/dL).  Liver Function Tests: Recent Labs  Lab 05/04/21 0230 05/05/21 0313 05/06/21 0414  AST 155* 208* 115*  ALT 71* 294* 249*  ALKPHOS 93 113 124  BILITOT 1.5* 1.2 1.0  PROT 8.1 6.6 6.7  ALBUMIN 4.1 3.4* 3.4*    CBG: No results for input(s): GLUCAP in the last 168 hours.   Recent Results (from the past 240 hour(s))  SARS CORONAVIRUS 2 (TAT 6-24 HRS) Nasopharyngeal Nasopharyngeal Swab     Status: None   Collection Time: 05/04/21  7:02 AM   Specimen: Nasopharyngeal Swab  Result Value Ref Range Status   SARS Coronavirus 2 NEGATIVE NEGATIVE Final    Comment: (NOTE) SARS-CoV-2 target nucleic acids are NOT DETECTED.  The SARS-CoV-2 RNA is generally detectable in upper and lower respiratory specimens during the acute phase of infection. Negative results do not preclude SARS-CoV-2 infection, do not rule out co-infections with other pathogens, and should not be used as the sole basis for treatment or other patient management decisions. Negative results must be combined with clinical  observations, patient history, and epidemiological information. The expected result is Negative.  Fact Sheet for Patients: HairSlick.no  Fact Sheet for Healthcare Providers: quierodirigir.com  This test is not yet approved or cleared by the [place] FDA and  has been authorized for detection and/or diagnosis of SARS-CoV-2 by FDA under an Emergency Use Authorization (EUA). This EUA will remain  in effect (meaning this test can be used) for the duration of the COVID-19 declaration under Se ction 564(b)(1) of the Act, 21 U.S.C. section 360bbb-3(b)(1), unless the authorization is terminated or revoked sooner.  Performed at [facility], [address] [place], [place] [address]          Radiology Studies: MR 3D Recon At Scanner  Result Date: 05/04/2021 CLINICAL DATA:  Choledocholithiasis, abdominal  pain EXAM: MRI ABDOMEN WITHOUT AND WITH CONTRAST (INCLUDING MRCP) TECHNIQUE: Multiplanar multisequence MR imaging of the abdomen was performed both before and after the administration of intravenous contrast. Heavily T2-weighted images of the biliary and pancreatic ducts were obtained, and three-dimensional MRCP images were rendered by post processing. CONTRAST:  9mL GADAVIST GADOBUTROL 1 MMOL/ML IV SOLN COMPARISON:  Same day CT abdomen pelvis FINDINGS: Lower chest: No acute findings. Hepatobiliary: No mass or other parenchymal abnormality identified. Status post cholecystectomy. There is intra and extrahepatic biliary ductal dilatation, the common bile duct measuring up to 1.2 cm in caliber. There is metallic susceptibility artifact near the ampulla, in keeping with observation of the metallic clip on prior CT. There is a small calculus in the distal common bile duct measuring no greater than 3-4 mm (series 9, image 41). Pancreas: No mass, inflammatory changes, or other parenchymal abnormality identified. Spleen:  Within normal  limits in size and appearance. Adrenals/Urinary Tract: No masses identified. No evidence of hydronephrosis. Stomach/Bowel: Visualized portions within the abdomen are unremarkable. Vascular/Lymphatic: No pathologically enlarged lymph nodes identified. No abdominal aortic aneurysm demonstrated. Other:  None. Musculoskeletal: No suspicious bone lesions identified. IMPRESSION: 1. There is intra and extrahepatic biliary ductal dilatation, the common bile duct measuring up to 1.2 cm in caliber. 2. There is a small calculus in the distal common bile duct measuring no greater than 3-4 mm. 3. There is metallic susceptibility artifact near the ampulla, in keeping with observation of the metallic clip on prior CT. Electronically Signed   By: Lauralyn PrimesAlex  Bibbey M.D.   On: 05/04/2021 13:41   DG ERCP  Result Date: 05/05/2021 CLINICAL DATA:  Choledocholithiasis.  ERCP. EXAM: ERCP TECHNIQUE: Multiple spot images obtained with the fluoroscopic device and submitted for interpretation post-procedure. FLUOROSCOPY TIME:  2 minutes, 41 seconds COMPARISON:  MRCP-05/04/2021; CT abdomen pelvis-05/11/2021 FINDINGS: Four spot intraoperative fluoroscopic images of the right upper abdominal quadrant during ERCP are provided for review Initial image demonstrates an ERCP probe overlying the right upper abdominal quadrant with selective cannulation and opacification of common bile duct which appears markedly dilated. Surgical clips overlie the expected location of the gallbladder fossa. Subsequent images demonstrate further opacification of the common bile duct with apparent nonocclusive filling defects within the distal aspect of the CBD suggestive of choledocholithiasis (image 3). Subsequent images demonstrate insufflation of a balloon within mid aspect of the CBD with subsequent presumed biliary sweeping, stone extraction and biliary sphincterotomy IMPRESSION: ERCP with findings suggestive of choledocholithiasis with subsequent biliary sweeping,  stone extraction and presumed sphincterotomy. These images were submitted for radiologic interpretation only. Please see the procedural report for the amount of contrast and the fluoroscopy time utilized. Electronically Signed   By: Simonne ComeJohn  Watts M.D.   On: 05/05/2021 13:46   MR ABDOMEN MRCP W WO CONTAST  Result Date: 05/04/2021 CLINICAL DATA:  Choledocholithiasis, abdominal pain EXAM: MRI ABDOMEN WITHOUT AND WITH CONTRAST (INCLUDING MRCP) TECHNIQUE: Multiplanar multisequence MR imaging of the abdomen was performed both before and after the administration of intravenous contrast. Heavily T2-weighted images of the biliary and pancreatic ducts were obtained, and three-dimensional MRCP images were rendered by post processing. CONTRAST:  9mL GADAVIST GADOBUTROL 1 MMOL/ML IV SOLN COMPARISON:  Same day CT abdomen pelvis FINDINGS: Lower chest: No acute findings. Hepatobiliary: No mass or other parenchymal abnormality identified. Status post cholecystectomy. There is intra and extrahepatic biliary ductal dilatation, the common bile duct measuring up to 1.2 cm in caliber. There is metallic susceptibility artifact near the ampulla, in keeping  with observation of the metallic clip on prior CT. There is a small calculus in the distal common bile duct measuring no greater than 3-4 mm (series 9, image 41). Pancreas: No mass, inflammatory changes, or other parenchymal abnormality identified. Spleen:  Within normal limits in size and appearance. Adrenals/Urinary Tract: No masses identified. No evidence of hydronephrosis. Stomach/Bowel: Visualized portions within the abdomen are unremarkable. Vascular/Lymphatic: No pathologically enlarged lymph nodes identified. No abdominal aortic aneurysm demonstrated. Other:  None. Musculoskeletal: No suspicious bone lesions identified. IMPRESSION: 1. There is intra and extrahepatic biliary ductal dilatation, the common bile duct measuring up to 1.2 cm in caliber. 2. There is a small calculus  in the distal common bile duct measuring no greater than 3-4 mm. 3. There is metallic susceptibility artifact near the ampulla, in keeping with observation of the metallic clip on prior CT. Electronically Signed   By: Lauralyn Primes M.D.   On: 05/04/2021 13:41        Scheduled Meds: . enoxaparin (LOVENOX) injection  40 mg Subcutaneous Q24H  . indomethacin  100 mg Rectal Once  . [START ON 05/07/2021] pantoprazole  40 mg Oral Daily   Continuous Infusions: . sodium chloride 125 mL/hr at 05/06/21 0555     LOS: 1 day    Time spent: 35 minutes    Ramiro Harvest, MD [facility]   To contact the attending provider between 7A-7P or the covering provider during after hours 7P-7A, please log into the web site [email or web address] and access using universal [place] password for that web site. If you do not have the password, please call the hospital operator.  05/06/2021, 11:34 AM

## 2021-05-07 DIAGNOSIS — R1013 Epigastric pain: Secondary | ICD-10-CM

## 2021-05-07 LAB — COMPREHENSIVE METABOLIC PANEL
ALT: 186 U/L — ABNORMAL HIGH (ref 0–44)
AST: 69 U/L — ABNORMAL HIGH (ref 15–41)
Albumin: 3.3 g/dL — ABNORMAL LOW (ref 3.5–5.0)
Alkaline Phosphatase: 109 U/L (ref 38–126)
Anion gap: 6 (ref 5–15)
BUN: 5 mg/dL — ABNORMAL LOW (ref 6–20)
CO2: 23 mmol/L (ref 22–32)
Calcium: 8.4 mg/dL — ABNORMAL LOW (ref 8.9–10.3)
Chloride: 110 mmol/L (ref 98–111)
Creatinine, Ser: 0.81 mg/dL (ref 0.44–1.00)
GFR, Estimated: >60 mL/min (ref >60)
Glucose, Bld: 87 mg/dL (ref 70–99)
Potassium: 3.7 mmol/L (ref 3.5–5.1)
Sodium: 139 mmol/L (ref 135–145)
Total Bilirubin: 0.6 mg/dL (ref 0.3–1.2)
Total Protein: 6.3 g/dL — ABNORMAL LOW (ref 6.5–8.1)

## 2021-05-07 LAB — CBC WITH DIFFERENTIAL/PLATELET
Abs Immature Granulocytes: 0.01 10*3/uL (ref 0.00–0.07)
Basophils Absolute: 0.1 10*3/uL (ref 0.0–0.1)
Basophils Relative: 1 %
Eosinophils Absolute: 0.1 10*3/uL (ref 0.0–0.5)
Eosinophils Relative: 2 %
HCT: 34.6 % — ABNORMAL LOW (ref 36.0–46.0)
Hemoglobin: 11.8 g/dL — ABNORMAL LOW (ref 12.0–15.0)
Immature Granulocytes: 0 %
Lymphocytes Relative: 44 %
Lymphs Abs: 2.8 10*3/uL (ref 0.7–4.0)
MCH: 30.1 pg (ref 26.0–34.0)
MCHC: 34.1 g/dL (ref 30.0–36.0)
MCV: 88.3 fL (ref 80.0–100.0)
Monocytes Absolute: 0.5 10*3/uL (ref 0.1–1.0)
Monocytes Relative: 8 %
Neutro Abs: 2.9 10*3/uL (ref 1.7–7.7)
Neutrophils Relative %: 45 %
Platelets: 238 10*3/uL (ref 150–400)
RBC: 3.92 MIL/uL (ref 3.87–5.11)
RDW: 11.9 % (ref 11.5–15.5)
WBC: 6.4 10*3/uL (ref 4.0–10.5)
nRBC: 0 % (ref 0.0–0.2)

## 2021-05-07 LAB — MAGNESIUM: Magnesium: 1.7 mg/dL (ref 1.7–2.4)

## 2021-05-07 NOTE — Progress Notes (Signed)
[facility] Gastroenterology Progress Note  Jessica Kirk 35 y.o. Oct 18, 1986  CC:  Choledocholithiasis s/p ERCP 5/16  Subjective: Patient reports feeling much better.  She denies any nausea/vomiting.  Reports some mild epigastric and LUQ pain after eating.  Is interested in advancing diet and is interested in discharge.  Has not had a BM but states she does not feel constipated and declines Miralax.  She is passing flatus.  ROS : Review of Systems  Cardiovascular: Negative for chest pain and palpitations.  Gastrointestinal: Positive for abdominal pain. Negative for blood in stool, constipation, diarrhea, heartburn, melena, nausea and vomiting.   Objective: Vital signs in last 24 hours: Vitals:   05/06/21 2108 05/07/21 0642  BP: 123/78 123/74  Pulse: (!) 54 62  Resp: 18 18  Temp: 98.4 F (36.9 C) 98.7 F (37.1 C)  SpO2: 97% 97%    Physical Exam:  General:  Alert, cooperative, no distress  Head:  Normocephalic, without obvious abnormality, atraumatic  Eyes:  Anicteric sclera, EOMs intact  Lungs:   Clear to auscultation bilaterally, respirations unlabored  Heart:  Regular rate and rhythm, S1, S2 normal  Abdomen:   Soft, non-tender, non-distended, bowel sounds active all four quadrants,  no masses  Extremities: Extremities normal, atraumatic, no  edema    Lab Results: Recent Labs    05/06/21 0414 05/07/21 0421  NA 137 139  K 4.1 3.7  CL 110 110  CO2 20* 23  GLUCOSE 89 87  BUN 7 5*  CREATININE 0.77 0.81  CALCIUM 8.5* 8.4*  MG 2.0 1.7   Recent Labs    05/06/21 0414 05/07/21 0421  AST 115* 69*  ALT 249* 186*  ALKPHOS 124 109  BILITOT 1.0 0.6  PROT 6.7 6.3*  ALBUMIN 3.4* 3.3*   Recent Labs    05/06/21 0414 05/07/21 0421  WBC 8.8 6.4  NEUTROABS 6.8 2.9  HGB 12.5 11.8*  HCT 37.2 34.6*  MCV 88.8 88.3  PLT 254 238   No results for input(s): LABPROT, INR in the last 72 hours.    Assessment: Choledocholithiasis s/p ERCP 5/16 with biliary sphincterotomy and  balloon extraction -AST/ALT downtrending. Today, ALT 186/ AST 69 -T. Bili and ALP remain normal -No leukocytosis  Plan: Advance to soft diet.  OK to discharge from a GI standpoint.  Trend LFTs to normalization as an outpatient (either with CCS or GI).  [facility] will sign off. Please contact us if we can be of any further assistance during this hospital stay.  Edrick Kins PA-C 05/07/2021, 10:31 AM  Contact #  [phone]

## 2021-05-07 NOTE — Discharge Summary (Addendum)
Physician Discharge Summary  Jessica Kirk ZOX:[id number] DOB: 04/29/1986 DOA: 05/04/2021  PCP: Pcp, No  Admit date: 05/04/2021 Discharge date: 05/08/2021  Admitted From: Home Disposition: Home  recommendations for Outpatient Follow-up:  1. Follow up with PCP in 1-2 weeks 2. Please obtain CMP/CBC in one week 3. Follow-up with GI to recheck LFTs.  Home Health: None Equipment/Devices: None Discharge Condition: Stable CODE STATUS: Full code Diet recommendation: Cardiac diet Brief/Interim Summary:35 year old female history of arthritis presenting to the ED with abdominal pain, nausea vomiting which started 2 days prior to admission nonradiating and in the epigastric region.  Patient with prior history of cholecystectomy in 2007. Patient underwent right upper quadrant ultrasound which showed dilated common bile duct status postcholecystectomy.  CT abdomen and pelvis done consistent with choledocholithiasis and possibly eroded clip.  GI consulted and MRCP recommended which was done and concerning for intra and extrahepatic biliary duct dilatation with common bile duct measuring 1.2 cm, small calculus noted in the distal CBD, metallic susceptibility artifact near the ampulla. Patient for ERCP today 05/05/2021  Discharge Diagnoses:  Active Problems:   Abdominal pain   Choledocholithiasis  #1 abdominal pain right upper quadrant and epigastric secondary to choledocholithiasis status post ERCP with CBD stone removal by Dr. Dulce Sellar 05/05/2021.  Patient had a cholecystectomy in 2007.  Patient tolerated regular diet prior to discharge.  She will need to follow-up with GI to trend LFTs.  Advised to quit marijuana abuse.   Estimated body mass index is 35.19 kg/m as calculated from the following:   Height as of this encounter: 5\' 4"  (1.626 m).   Weight as of this encounter: 93 kg.  Discharge Instructions  Discharge Instructions    Call MD for:  difficulty breathing, headache or visual disturbances    Complete by: As directed    Call MD for:  persistant nausea and vomiting   Complete by: As directed    Call MD for:  redness, tenderness, or signs of infection (pain, swelling, redness, odor or green/yellow discharge around incision site)   Complete by: As directed    Diet - low sodium heart healthy   Complete by: As directed    Increase activity slowly   Complete by: As directed      Allergies as of 05/07/2021   No Known Allergies     Medication List    STOP taking these medications   naproxen sodium 275 MG tablet Commonly known as: ANAPROX     TAKE these medications   gabapentin 300 MG capsule Commonly known as: NEURONTIN Take 900 mg by mouth daily.       Follow-up Information    05/09/2021, MD Follow up.   Specialty: Gastroenterology Contact information: [address] [place] [phone]              No Known Allergies  Consultations:GI SURGERY  Procedures/Studies: CT ABDOMEN PELVIS WO CONTRAST  Result Date: 05/04/2021 CLINICAL DATA:  Nausea and vomiting due to marijuana use with epigastric pain EXAM: CT ABDOMEN AND PELVIS WITHOUT CONTRAST TECHNIQUE: Multidetector CT imaging of the abdomen and pelvis was performed following the standard protocol without IV contrast. COMPARISON:  Abdominal ultrasound from earlier today FINDINGS: Lower chest:  Atelectatic type opacity in the right lower lobe. Hepatobiliary: No focal liver abnormality.Cholecystectomy. Mildly dilated common bile duct based on prior. 2 high-density structures in the distal common bile duct, 1 elongated and suggestive of a clip and the other 3 mm and rounded, suggestive of stone.  Pancreas: Unremarkable. Spleen: Unremarkable. Adrenals/Urinary Tract: Negative adrenals. No hydronephrosis or stone. Unremarkable bladder. Stomach/Bowel:  No obstruction. No appendicitis. Vascular/Lymphatic: No acute vascular abnormality. No mass or adenopathy. Reproductive:No pathologic  findings. Other: No ascites or pneumoperitoneum. Musculoskeletal: No acute abnormalities. IMPRESSION: Two opaque structures in the distal common bile duct, 1 most consistent with a clip (presumably eroded into the lumen) and the other suggesting small stone. Additional more proximal calculi may be present. Electronically Signed   By: Marnee SpringJonathon  Watts M.D.   On: 05/04/2021 10:26   MR 3D Recon At Scanner  Result Date: 05/04/2021 CLINICAL DATA:  Choledocholithiasis, abdominal pain EXAM: MRI ABDOMEN WITHOUT AND WITH CONTRAST (INCLUDING MRCP) TECHNIQUE: Multiplanar multisequence MR imaging of the abdomen was performed both before and after the administration of intravenous contrast. Heavily T2-weighted images of the biliary and pancreatic ducts were obtained, and three-dimensional MRCP images were rendered by post processing. CONTRAST:  9mL GADAVIST GADOBUTROL 1 MMOL/ML IV SOLN COMPARISON:  Same day CT abdomen pelvis FINDINGS: Lower chest: No acute findings. Hepatobiliary: No mass or other parenchymal abnormality identified. Status post cholecystectomy. There is intra and extrahepatic biliary ductal dilatation, the common bile duct measuring up to 1.2 cm in caliber. There is metallic susceptibility artifact near the ampulla, in keeping with observation of the metallic clip on prior CT. There is a small calculus in the distal common bile duct measuring no greater than 3-4 mm (series 9, image 41). Pancreas: No mass, inflammatory changes, or other parenchymal abnormality identified. Spleen:  Within normal limits in size and appearance. Adrenals/Urinary Tract: No masses identified. No evidence of hydronephrosis. Stomach/Bowel: Visualized portions within the abdomen are unremarkable. Vascular/Lymphatic: No pathologically enlarged lymph nodes identified. No abdominal aortic aneurysm demonstrated. Other:  None. Musculoskeletal: No suspicious bone lesions identified. IMPRESSION: 1. There is intra and extrahepatic biliary  ductal dilatation, the common bile duct measuring up to 1.2 cm in caliber. 2. There is a small calculus in the distal common bile duct measuring no greater than 3-4 mm. 3. There is metallic susceptibility artifact near the ampulla, in keeping with observation of the metallic clip on prior CT. Electronically Signed   By: Lauralyn PrimesAlex  Bibbey M.D.   On: 05/04/2021 13:41   DG ERCP  Result Date: 05/05/2021 CLINICAL DATA:  Choledocholithiasis.  ERCP. EXAM: ERCP TECHNIQUE: Multiple spot images obtained with the fluoroscopic device and submitted for interpretation post-procedure. FLUOROSCOPY TIME:  2 minutes, 41 seconds COMPARISON:  MRCP-05/04/2021; CT abdomen pelvis-05/11/2021 FINDINGS: Four spot intraoperative fluoroscopic images of the right upper abdominal quadrant during ERCP are provided for review Initial image demonstrates an ERCP probe overlying the right upper abdominal quadrant with selective cannulation and opacification of common bile duct which appears markedly dilated. Surgical clips overlie the expected location of the gallbladder fossa. Subsequent images demonstrate further opacification of the common bile duct with apparent nonocclusive filling defects within the distal aspect of the CBD suggestive of choledocholithiasis (image 3). Subsequent images demonstrate insufflation of a balloon within mid aspect of the CBD with subsequent presumed biliary sweeping, stone extraction and biliary sphincterotomy IMPRESSION: ERCP with findings suggestive of choledocholithiasis with subsequent biliary sweeping, stone extraction and presumed sphincterotomy. These images were submitted for radiologic interpretation only. Please see the procedural report for the amount of contrast and the fluoroscopy time utilized. Electronically Signed   By: Simonne ComeJohn  Watts M.D.   On: 05/05/2021 13:46   MR ABDOMEN MRCP W WO CONTAST  Result Date: 05/04/2021 CLINICAL DATA:  Choledocholithiasis, abdominal pain EXAM: MRI ABDOMEN WITHOUT AND  WITH  CONTRAST (INCLUDING MRCP) TECHNIQUE: Multiplanar multisequence MR imaging of the abdomen was performed both before and after the administration of intravenous contrast. Heavily T2-weighted images of the biliary and pancreatic ducts were obtained, and three-dimensional MRCP images were rendered by post processing. CONTRAST:  26mL GADAVIST GADOBUTROL 1 MMOL/ML IV SOLN COMPARISON:  Same day CT abdomen pelvis FINDINGS: Lower chest: No acute findings. Hepatobiliary: No mass or other parenchymal abnormality identified. Status post cholecystectomy. There is intra and extrahepatic biliary ductal dilatation, the common bile duct measuring up to 1.2 cm in caliber. There is metallic susceptibility artifact near the ampulla, in keeping with observation of the metallic clip on prior CT. There is a small calculus in the distal common bile duct measuring no greater than 3-4 mm (series 9, image 41). Pancreas: No mass, inflammatory changes, or other parenchymal abnormality identified. Spleen:  Within normal limits in size and appearance. Adrenals/Urinary Tract: No masses identified. No evidence of hydronephrosis. Stomach/Bowel: Visualized portions within the abdomen are unremarkable. Vascular/Lymphatic: No pathologically enlarged lymph nodes identified. No abdominal aortic aneurysm demonstrated. Other:  None. Musculoskeletal: No suspicious bone lesions identified. IMPRESSION: 1. There is intra and extrahepatic biliary ductal dilatation, the common bile duct measuring up to 1.2 cm in caliber. 2. There is a small calculus in the distal common bile duct measuring no greater than 3-4 mm. 3. There is metallic susceptibility artifact near the ampulla, in keeping with observation of the metallic clip on prior CT. Electronically Signed   By: Lauralyn Primes M.D.   On: 05/04/2021 13:41   US Abdomen Limited RUQ (LIVER/GB)  Result Date: 05/04/2021 CLINICAL DATA:  Right upper quadrant pain EXAM: ULTRASOUND ABDOMEN LIMITED RIGHT UPPER QUADRANT  COMPARISON:  None. FINDINGS: Gallbladder: Status post cholecystectomy. Small cystic area within the gallbladder measures 2.7 x 0.7 x 1.3 cm. No free fluid identified within the gallbladder fossa. Common bile duct: Diameter: 13.7 mm Liver: No focal lesion identified. Within normal limits in parenchymal echogenicity. Portal vein is patent on color Doppler imaging with normal direction of blood flow towards the liver. Other: None. IMPRESSION: 1. Dilated common bile duct status post cholecystectomy. If there are clinical signs or symptoms concerning for choledocholithiasis consider further evaluation with MRCP. 2. Small cystic structure within the gallbladder fossa may represent gallbladder or cystic duct remnant status post cholecystectomy. Electronically Signed   By: Signa Kell M.D.   On: 05/04/2021 05:20    (Echo, Carotid, EGD, Colonoscopy, ERCP)    Subjective:  Patient resting in bed denies any new complaints able to tolerate p.o. intake Discharge Exam: Vitals:   05/06/21 2108 05/07/21 0642  BP: 123/78 123/74  Pulse: (!) 54 62  Resp: 18 18  Temp: 98.4 F (36.9 C) 98.7 F (37.1 C)  SpO2: 97% 97%   Vitals:   05/06/21 0555 05/06/21 1346 05/06/21 2108 05/07/21 0642  BP: (!) 102/58 135/82 123/78 123/74  Pulse: (!) 57 75 (!) 54 62  Resp: 18 17 18 18   Temp: 98.2 F (36.8 C) 98.3 F (36.8 C) 98.4 F (36.9 C) 98.7 F (37.1 C)  TempSrc: Oral Oral Oral Oral  SpO2: 99% 100% 97% 97%  Weight:      Height:        General: Pt is alert, awake, not in acute distress Cardiovascular: RRR, S1/S2 +, no rubs, no gallops Respiratory: CTA bilaterally, no wheezing, no rhonchi Abdominal: Soft, NT, ND, bowel sounds + Extremities: no edema, no cyanosis    The results of significant diagnostics from this  hospitalization (including imaging, microbiology, ancillary and laboratory) are listed below for reference.     Microbiology: Recent Results (from the past 240 hour(s))  SARS CORONAVIRUS 2  (TAT 6-24 HRS) Nasopharyngeal Nasopharyngeal Swab     Status: None   Collection Time: 05/04/21  7:02 AM   Specimen: Nasopharyngeal Swab  Result Value Ref Range Status   SARS Coronavirus 2 NEGATIVE NEGATIVE Final    Comment: (NOTE) SARS-CoV-2 target nucleic acids are NOT DETECTED.  The SARS-CoV-2 RNA is generally detectable in upper and lower respiratory specimens during the acute phase of infection. Negative results do not preclude SARS-CoV-2 infection, do not rule out co-infections with other pathogens, and should not be used as the sole basis for treatment or other patient management decisions. Negative results must be combined with clinical observations, patient history, and epidemiological information. The expected result is Negative.  Fact Sheet for Patients: HairSlick.no  Fact Sheet for Healthcare Providers: quierodirigir.com  This test is not yet approved or cleared by the [place] FDA and  has been authorized for detection and/or diagnosis of SARS-CoV-2 by FDA under an Emergency Use Authorization (EUA). This EUA will remain  in effect (meaning this test can be used) for the duration of the COVID-19 declaration under Se ction 564(b)(1) of the Act, 21 U.S.C. section 360bbb-3(b)(1), unless the authorization is terminated or revoked sooner.  Performed at [facility], [address], [place], [place] [address]      Labs: BNP (last 3 results) No results for input(s): BNP in the last 8760 hours. Basic Metabolic Panel: Recent Labs  Lab 05/04/21 0230 05/04/21 1412 05/05/21 0313 05/06/21 0414 05/07/21 0421  NA 139  --  139 137 139  K 3.6  --  3.6 4.1 3.7  CL 109  --  108 110 110  CO2 21*  --  25 20* 23  GLUCOSE 110*  --  89 89 87  BUN 11  --  7 7 5*  CREATININE 0.67 0.81 0.82 0.77 0.81  CALCIUM 9.5  --  8.4* 8.5* 8.4*  MG  --   --   --  2.0 1.7   Liver Function Tests: Recent Labs  Lab  05/04/21 0230 05/05/21 0313 05/06/21 0414 05/07/21 0421  AST 155* 208* 115* 69*  ALT 71* 294* 249* 186*  ALKPHOS 93 113 124 109  BILITOT 1.5* 1.2 1.0 0.6  PROT 8.1 6.6 6.7 6.3*  ALBUMIN 4.1 3.4* 3.4* 3.3*   Recent Labs  Lab 05/04/21 0230  LIPASE 44   No results for input(s): AMMONIA in the last 168 hours. CBC: Recent Labs  Lab 05/04/21 0230 05/04/21 1412 05/05/21 0313 05/06/21 0414 05/07/21 0421  WBC 8.6 7.6 6.3 8.8 6.4  NEUTROABS  --   --  3.3 6.8 2.9  HGB 13.2 13.2 11.7* 12.5 11.8*  HCT 37.7 38.5 34.4* 37.2 34.6*  MCV 85.5 88.1 88.4 88.8 88.3  PLT 318 283 252 254 238   Cardiac Enzymes: No results for input(s): CKTOTAL, CKMB, CKMBINDEX, TROPONINI in the last 168 hours. BNP: Invalid input(s): POCBNP CBG: No results for input(s): GLUCAP in the last 168 hours. D-Dimer No results for input(s): DDIMER in the last 72 hours. Hgb A1c No results for input(s): HGBA1C in the last 72 hours. Lipid Profile No results for input(s): CHOL, HDL, LDLCALC, TRIG, CHOLHDL, LDLDIRECT in the last 72 hours. Thyroid function studies No results for input(s): TSH, T4TOTAL, T3FREE, THYROIDAB in the last 72 hours.  Invalid input(s): FREET3 Anemia work up  No results for input(s): VITAMINB12, FOLATE, FERRITIN, TIBC, IRON, RETICCTPCT in the last 72 hours. Urinalysis    Component Value Date/Time   COLORURINE YELLOW 05/04/2021 0153   APPEARANCEUR CLEAR 05/04/2021 0153   LABSPEC 1.013 05/04/2021 0153   PHURINE 8.0 05/04/2021 0153   GLUCOSEU NEGATIVE 05/04/2021 0153   HGBUR MODERATE (A) 05/04/2021 0153   BILIRUBINUR NEGATIVE 05/04/2021 0153   KETONESUR NEGATIVE 05/04/2021 0153   PROTEINUR NEGATIVE 05/04/2021 0153   NITRITE NEGATIVE 05/04/2021 0153   LEUKOCYTESUR NEGATIVE 05/04/2021 0153   Sepsis Labs Invalid input(s): PROCALCITONIN,  WBC,  LACTICIDVEN Microbiology Recent Results (from the past 240 hour(s))  SARS CORONAVIRUS 2 (TAT 6-24 HRS) Nasopharyngeal Nasopharyngeal Swab      Status: None   Collection Time: 05/04/21  7:02 AM   Specimen: Nasopharyngeal Swab  Result Value Ref Range Status   SARS Coronavirus 2 NEGATIVE NEGATIVE Final    Comment: (NOTE) SARS-CoV-2 target nucleic acids are NOT DETECTED.  The SARS-CoV-2 RNA is generally detectable in upper and lower respiratory specimens during the acute phase of infection. Negative results do not preclude SARS-CoV-2 infection, do not rule out co-infections with other pathogens, and should not be used as the sole basis for treatment or other patient management decisions. Negative results must be combined with clinical observations, patient history, and epidemiological information. The expected result is Negative.  Fact Sheet for Patients: HairSlick.no  Fact Sheet for Healthcare Providers: quierodirigir.com  This test is not yet approved or cleared by the [place] FDA and  has been authorized for detection and/or diagnosis of SARS-CoV-2 by FDA under an Emergency Use Authorization (EUA). This EUA will remain  in effect (meaning this test can be used) for the duration of the COVID-19 declaration under Se ction 564(b)(1) of the Act, 21 U.S.C. section 360bbb-3(b)(1), unless the authorization is terminated or revoked sooner.  Performed at [facility], [address], [place], [place] [address]      Time coordinating discharge:  38 minutes  SIGNED:   Alwyn Ren, MD  [facility] 05/07/2021, 10:33 AM

## 2021-05-07 NOTE — Progress Notes (Signed)
PROGRESS NOTE    Jessica Kirk  RUE:[id number] DOB: 1986/06/02 DOA: 05/04/2021 PCP: Pcp, No   Brief Narrative: 35 year old female history of arthritis presenting to the ED with abdominal pain, nausea vomiting which started 2 days prior to admission nonradiating and in the epigastric region.  Patient with prior history of cholecystectomy in 2007. Patient underwent right upper quadrant ultrasound which showed dilated common bile duct status postcholecystectomy.  CT abdomen and pelvis done consistent with choledocholithiasis and possibly eroded clip.  GI consulted and MRCP recommended which was done and concerning for intra and extrahepatic biliary duct dilatation with common bile duct measuring 1.2 cm, small calculus noted in the distal CBD, metallic susceptibility artifact near the ampulla. s/p ERCP  05/05/2021  Assessment & Plan:   Active Problems:   Abdominal pain   Choledocholithiasis   Epigastric abdominal pain    #1 epigastric pain right upper quadrant pain secondary to choledocholithiasis status post ERCP with CBD stone removal 05/05/2021.  Patient was scheduled for discharge today however after eating lunch she reported 10 out of 10 abdominal pain.  Will observe her overnight recheck LFTs in a.m.   Estimated body mass index is 35.19 kg/m as calculated from the following:   Height as of this encounter: 5\' 4"  (1.626 m).   Weight as of this encounter: 93 kg.  DVT prophylaxis: SCD  code Status: Full code Family Communication: None at bedside Disposition Plan:  Status is: Inpatient  Dispo: The patient is from: Home              Anticipated d/c is to: Home              Patient currently is not medically stable to d/c.   Difficult to place patient No    Consultants:   GI  Procedures: ERCP Antimicrobials: None  Subjective: Resting in bed earlier when I saw her.  I saw her after lunch when she was crying with pain 10 out of 10.  No vomiting had bowel movements and  flatus.  Objective: Vitals:   05/06/21 1346 05/06/21 2108 05/07/21 0642 05/07/21 1320  BP: 135/82 123/78 123/74 115/74  Pulse: 75 (!) 54 62 (!) 58  Resp: 17 18 18 18   Temp: 98.3 F (36.8 C) 98.4 F (36.9 C) 98.7 F (37.1 C) 98.2 F (36.8 C)  TempSrc: Oral Oral Oral Oral  SpO2: 100% 97% 97% 100%  Weight:      Height:        Intake/Output Summary (Last 24 hours) at 05/07/2021 1618 Last data filed at 05/07/2021 1400 Gross per 24 hour  Intake 3261.48 ml  Output 4000 ml  Net -738.52 ml   Filed Weights   05/04/21 0145  Weight: 93 kg    Examination:  General exam: Appears calm and comfortable  Respiratory system: Clear to auscultation. Respiratory effort normal. Cardiovascular system: S1 & S2 heard, RRR. No JVD, murmurs, rubs, gallops or clicks. No pedal edema. Gastrointestinal system: Abdomen is nondistended, soft and nontender. No organomegaly or masses felt. Normal bowel sounds heard. Central nervous system: Alert and oriented. No focal neurological deficits. Extremities: Symmetric 5 x 5 power. Skin: No rashes, lesions or ulcers Psychiatry: Judgement and insight appear normal. Mood & affect appropriate.     Data Reviewed: I have personally reviewed following labs and imaging studies  CBC: Recent Labs  Lab 05/04/21 0230 05/04/21 1412 05/05/21 0313 05/06/21 0414 05/07/21 0421  WBC 8.6 7.6 6.3 8.8 6.4  NEUTROABS  --   --  3.3 6.8 2.9  HGB 13.2 13.2 11.7* 12.5 11.8*  HCT 37.7 38.5 34.4* 37.2 34.6*  MCV 85.5 88.1 88.4 88.8 88.3  PLT 318 283 252 254 238   Basic Metabolic Panel: Recent Labs  Lab 05/04/21 0230 05/04/21 1412 05/05/21 0313 05/06/21 0414 05/07/21 0421  NA 139  --  139 137 139  K 3.6  --  3.6 4.1 3.7  CL 109  --  108 110 110  CO2 21*  --  25 20* 23  GLUCOSE 110*  --  89 89 87  BUN 11  --  7 7 5*  CREATININE 0.67 0.81 0.82 0.77 0.81  CALCIUM 9.5  --  8.4* 8.5* 8.4*  MG  --   --   --  2.0 1.7   GFR: Estimated Creatinine Clearance: 107.1  mL/min (by C-G formula based on SCr of 0.81 mg/dL). Liver Function Tests: Recent Labs  Lab 05/04/21 0230 05/05/21 0313 05/06/21 0414 05/07/21 0421  AST 155* 208* 115* 69*  ALT 71* 294* 249* 186*  ALKPHOS 93 113 124 109  BILITOT 1.5* 1.2 1.0 0.6  PROT 8.1 6.6 6.7 6.3*  ALBUMIN 4.1 3.4* 3.4* 3.3*   Recent Labs  Lab 05/04/21 0230  LIPASE 44   No results for input(s): AMMONIA in the last 168 hours. Coagulation Profile: No results for input(s): INR, PROTIME in the last 168 hours. Cardiac Enzymes: No results for input(s): CKTOTAL, CKMB, CKMBINDEX, TROPONINI in the last 168 hours. BNP (last 3 results) No results for input(s): PROBNP in the last 8760 hours. HbA1C: No results for input(s): HGBA1C in the last 72 hours. CBG: No results for input(s): GLUCAP in the last 168 hours. Lipid Profile: No results for input(s): CHOL, HDL, LDLCALC, TRIG, CHOLHDL, LDLDIRECT in the last 72 hours. Thyroid Function Tests: No results for input(s): TSH, T4TOTAL, FREET4, T3FREE, THYROIDAB in the last 72 hours. Anemia Panel: No results for input(s): VITAMINB12, FOLATE, FERRITIN, TIBC, IRON, RETICCTPCT in the last 72 hours. Sepsis Labs: No results for input(s): PROCALCITON, LATICACIDVEN in the last 168 hours.  Recent Results (from the past 240 hour(s))  SARS CORONAVIRUS 2 (TAT 6-24 HRS) Nasopharyngeal Nasopharyngeal Swab     Status: None   Collection Time: 05/04/21  7:02 AM   Specimen: Nasopharyngeal Swab  Result Value Ref Range Status   SARS Coronavirus 2 NEGATIVE NEGATIVE Final    Comment: (NOTE) SARS-CoV-2 target nucleic acids are NOT DETECTED.  The SARS-CoV-2 RNA is generally detectable in upper and lower respiratory specimens during the acute phase of infection. Negative results do not preclude SARS-CoV-2 infection, do not rule out co-infections with other pathogens, and should not be used as the sole basis for treatment or other patient management decisions. Negative results must be  combined with clinical observations, patient history, and epidemiological information. The expected result is Negative.  Fact Sheet for Patients: HairSlick.no  Fact Sheet for Healthcare Providers: quierodirigir.com  This test is not yet approved or cleared by the [place] FDA and  has been authorized for detection and/or diagnosis of SARS-CoV-2 by FDA under an Emergency Use Authorization (EUA). This EUA will remain  in effect (meaning this test can be used) for the duration of the COVID-19 declaration under Se ction 564(b)(1) of the Act, 21 U.S.C. section 360bbb-3(b)(1), unless the authorization is terminated or revoked sooner.  Performed at [facility], [address] [place], [place] [address]          Radiology Studies: No results found.  Scheduled Meds: . enoxaparin (LOVENOX) injection  40 mg Subcutaneous Q24H  . indomethacin  100 mg Rectal Once  . pantoprazole  40 mg Oral Daily   Continuous Infusions:   LOS: 2 days    Alwyn Ren, MD 05/07/2021, 4:18 PM

## 2021-05-08 MED ORDER — PANTOPRAZOLE SODIUM 40 MG PO TBEC: 40.0000 mg | DELAYED_RELEASE_TABLET | Freq: Every day | ORAL | 1 refills | Status: AC

## 2021-05-08 NOTE — Progress Notes (Signed)
[facility] Gastroenterology Progress Note  Jessica Kirk 35 y.o. July 13, 1986  CC:  Choledocholithiasis s/p ERCP 5/16  Subjective: Patient reports feeling better.  She states she took Protonix prior to eating and did not have any pain or nausea associated with eating. Denies any current abdominal pain.  Has not had a BM but is passing flatus.  ROS : Review of Systems  Cardiovascular: Negative for chest pain and palpitations.  Gastrointestinal: Negative for abdominal pain, blood in stool, constipation, diarrhea, heartburn, melena, nausea and vomiting.   Objective: Vital signs in last 24 hours: Vitals:   05/07/21 1320 05/08/21 0106  BP: 115/74 128/77  Pulse: (!) 58 (!) 57  Resp: 18 18  Temp: 98.2 F (36.8 C)   SpO2: 100% 98%    Physical Exam:  General:  Alert, cooperative, no distress  Head:  Normocephalic, without obvious abnormality, atraumatic  Eyes:  Anicteric sclera, EOMs intact  Lungs:   Clear to auscultation bilaterally, respirations unlabored  Heart:  Regular rate and rhythm, S1, S2 normal  Abdomen:   Soft, non-tender, non-distended, bowel sounds active all four quadrants,  no masses  Extremities: Extremities normal, atraumatic, no  edema    Lab Results: Recent Labs    05/06/21 0414 05/07/21 0421  NA 137 139  K 4.1 3.7  CL 110 110  CO2 20* 23  GLUCOSE 89 87  BUN 7 5*  CREATININE 0.77 0.81  CALCIUM 8.5* 8.4*  MG 2.0 1.7   Recent Labs    05/06/21 0414 05/07/21 0421  AST 115* 69*  ALT 249* 186*  ALKPHOS 124 109  BILITOT 1.0 0.6  PROT 6.7 6.3*  ALBUMIN 3.4* 3.3*   Recent Labs    05/06/21 0414 05/07/21 0421  WBC 8.8 6.4  NEUTROABS 6.8 2.9  HGB 12.5 11.8*  HCT 37.2 34.6*  MCV 88.8 88.3  PLT 254 238   No results for input(s): LABPROT, INR in the last 72 hours.    Assessment: Choledocholithiasis s/p ERCP 5/16 with biliary sphincterotomy and balloon extraction -AST/ALT downtrending. Today, ALT 186/ AST 69 as of 5/18 -T. Bili and ALP remain normal  as of 5/18 -No leukocytosis  Plan: If patient tolerated lunch, OK to discharge from a GI standpoint.  Trend LFTs to normalization as an outpatient (either with CCS or GI).  Edrick Kins PA-C 05/08/2021, 10:35 AM  Contact #  [phone]

## 2021-05-08 NOTE — Progress Notes (Signed)
Assessment unchanged. Pt verbalized understanding of dc instructions. Denies pain. Discharged to front entrance accompanied by NT.

## 2021-05-31 ENCOUNTER — Other Ambulatory Visit: Payer: Self-pay

## 2021-05-31 ENCOUNTER — Emergency Department
Admission: EM | Admit: 2021-05-31 | Discharge: 2021-05-31 | Disposition: A | Discharge status: Home / Self Care | Payer: Medicaid Other | Attending: Emergency Medicine | Admitting: Emergency Medicine

## 2021-05-31 DIAGNOSIS — Z5321 Procedure and treatment not carried out due to patient leaving prior to being seen by health care provider: Secondary | ICD-10-CM | POA: Diagnosis not present

## 2021-05-31 DIAGNOSIS — R079 Chest pain, unspecified: Secondary | ICD-10-CM | POA: Diagnosis present

## 2021-05-31 DIAGNOSIS — K869 Disease of pancreas, unspecified: Secondary | ICD-10-CM | POA: Insufficient documentation

## 2021-05-31 LAB — BASIC METABOLIC PANEL
Anion gap: 7 (ref 5–15)
BUN: 13 mg/dL (ref 6–20)
CO2: 26 mmol/L (ref 22–32)
Calcium: 8.6 mg/dL — ABNORMAL LOW (ref 8.9–10.3)
Chloride: 104 mmol/L (ref 98–111)
Creatinine, Ser: 0.81 mg/dL (ref 0.44–1.00)
GFR, Estimated: >60 mL/min (ref >60)
Glucose, Bld: 118 mg/dL — ABNORMAL HIGH (ref 70–99)
Potassium: 3.2 mmol/L — ABNORMAL LOW (ref 3.5–5.1)
Sodium: 137 mmol/L (ref 135–145)

## 2021-05-31 LAB — CBC
HCT: 37 % (ref 36.0–46.0)
Hemoglobin: 13.1 g/dL (ref 12.0–15.0)
MCH: 30 pg (ref 26.0–34.0)
MCHC: 35.4 g/dL (ref 30.0–36.0)
MCV: 84.7 fL (ref 80.0–100.0)
Platelets: 280 10*3/uL (ref 150–400)
RBC: 4.37 MIL/uL (ref 3.87–5.11)
RDW: 11.8 % (ref 11.5–15.5)
WBC: 8.2 10*3/uL (ref 4.0–10.5)
nRBC: 0 % (ref 0.0–0.2)

## 2021-05-31 LAB — LIPASE, BLOOD: Lipase: 56 U/L — ABNORMAL HIGH (ref 11–51)

## 2021-05-31 LAB — TROPONIN I (HIGH SENSITIVITY): Troponin I (High Sensitivity): 3 ng/L (ref <18)

## 2021-05-31 MED ORDER — ACETAMINOPHEN 500 MG PO TABS
1000.0000 mg | ORAL_TABLET | Freq: Once | ORAL | Status: AC
  Administered 2021-05-31: 1000 mg via ORAL

## 2021-05-31 MED ORDER — ONDANSETRON 4 MG PO TBDP
4.0000 mg | ORAL_TABLET | Freq: Once | ORAL | Status: AC | PRN
  Administered 2021-05-31: 4 mg via ORAL
  Filled 2021-05-31: qty 1

## 2021-05-31 MED ORDER — ACETAMINOPHEN 500 MG PO TABS
ORAL_TABLET | ORAL | Status: AC
  Filled 2021-05-31: qty 2

## 2021-05-31 MED ORDER — OXYCODONE-ACETAMINOPHEN 5-325 MG PO TABS: 1.0000 | ORAL_TABLET | ORAL | Status: DC | PRN

## 2021-05-31 NOTE — ED Notes (Signed)
Pt called in the WR with  No response at this time

## 2021-05-31 NOTE — ED Triage Notes (Signed)
Pt with central chest pain and upper abd pain that began at work this am. Pt was recently diagnosed with pancreatic stones per ems. Pt with vomiting.

## 2021-05-31 NOTE — ED Notes (Signed)
Pt called in the WR with no response,.

## 2021-05-31 NOTE — ED Notes (Signed)
Pt called in the WR with no response at this time 

## 2021-11-27 IMAGING — US US ABDOMEN LIMITED RUQ/ASCITES
1 series · 14 of 25 positions shown · non-contrast
Comparison: None.

CLINICAL DATA: Right upper quadrant pain

EXAM:
ULTRASOUND ABDOMEN LIMITED RIGHT UPPER QUADRANT

[Series 1: us abdomen limited ruq/ascites · 75 acquisitions, 14 frames shown]
[im 1/75]
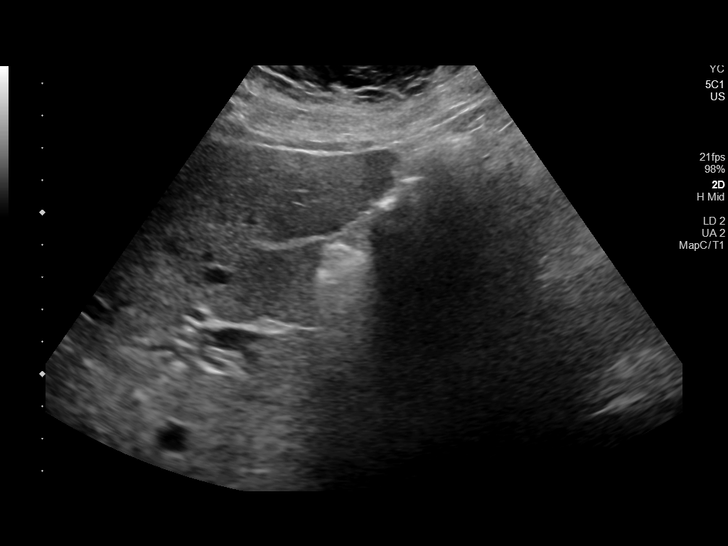
[im 7/75]
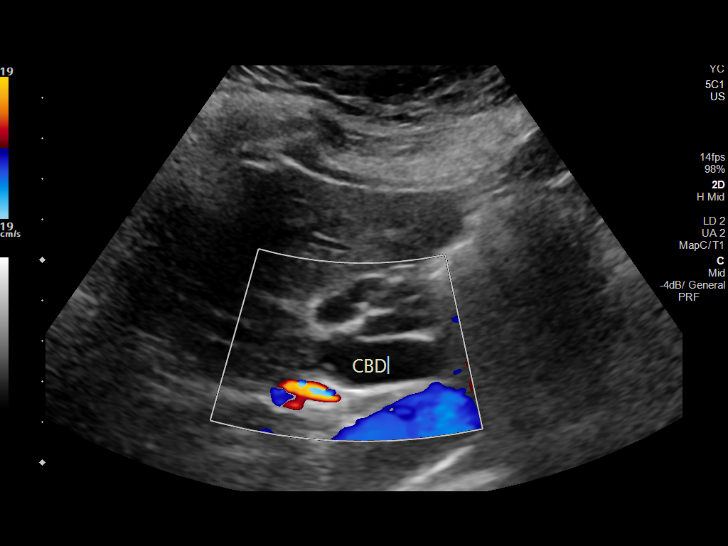
[im 13/75]
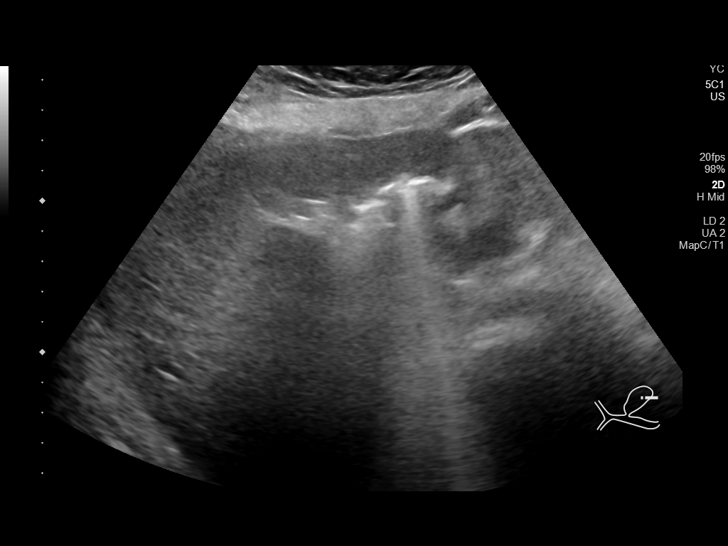
[im 19/75]
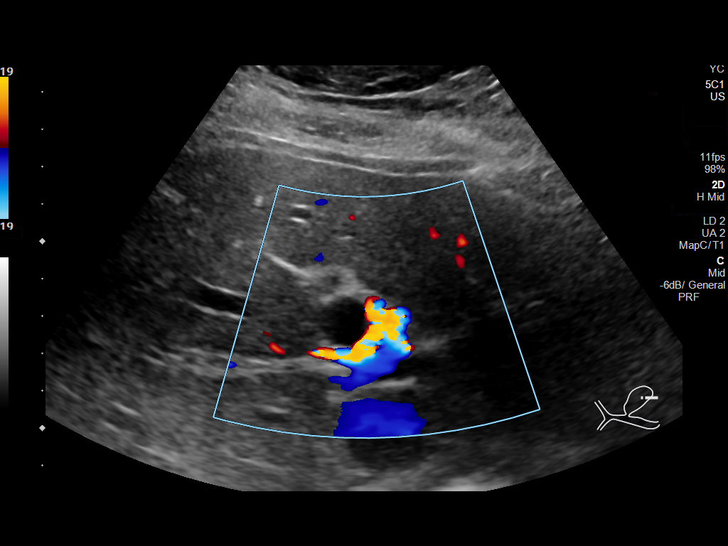
[im 25/75]
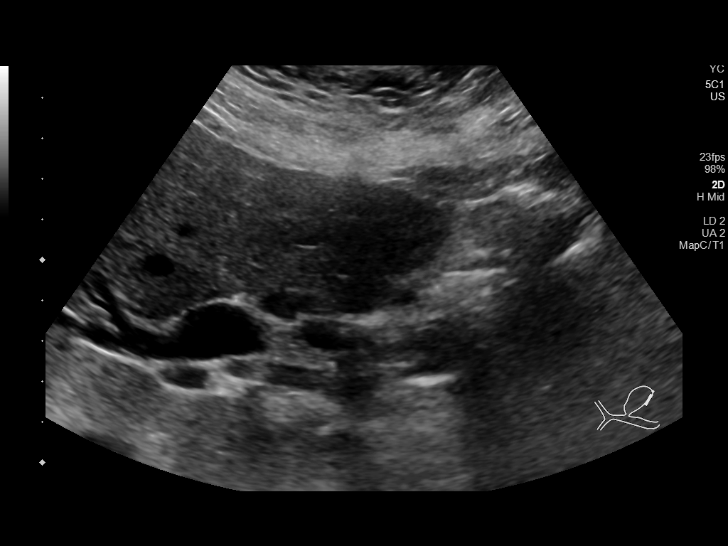
[im 28/75]
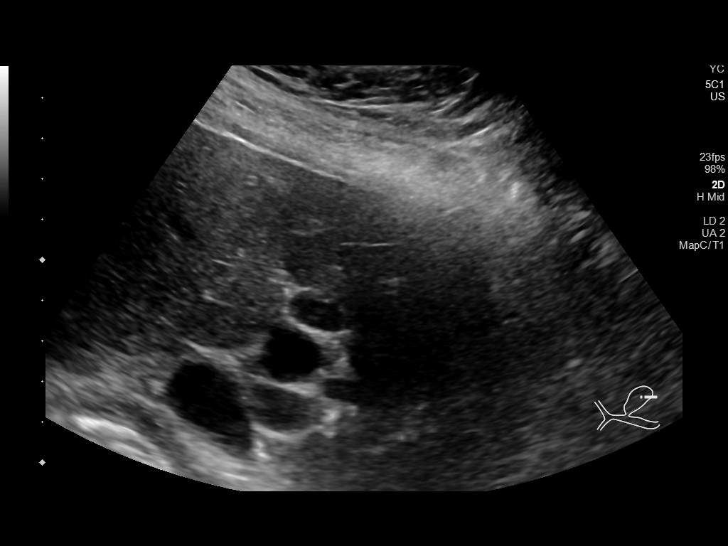
[im 34/75]
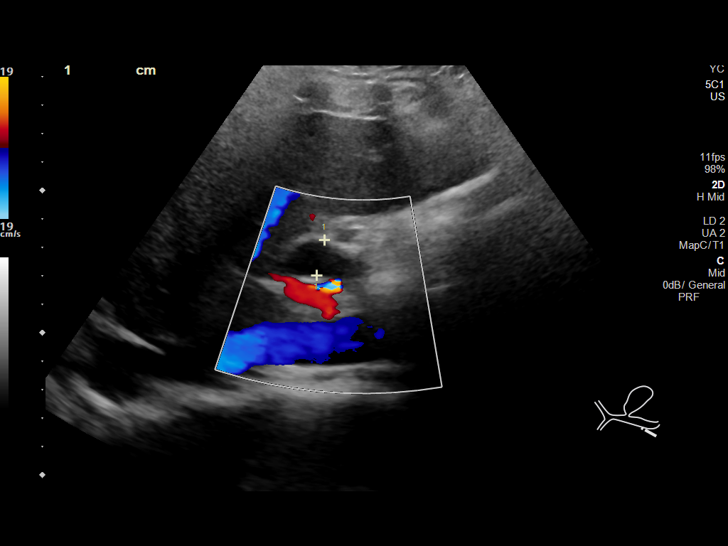
[im 41/75]
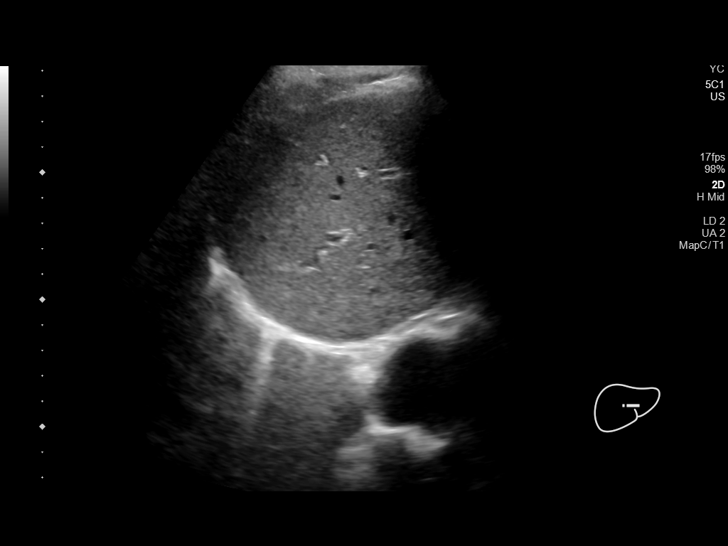
[im 47/75]
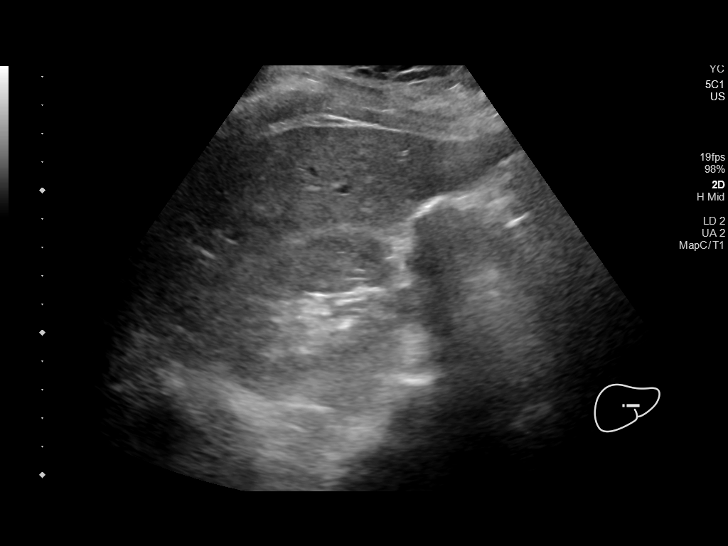
[im 50/75]
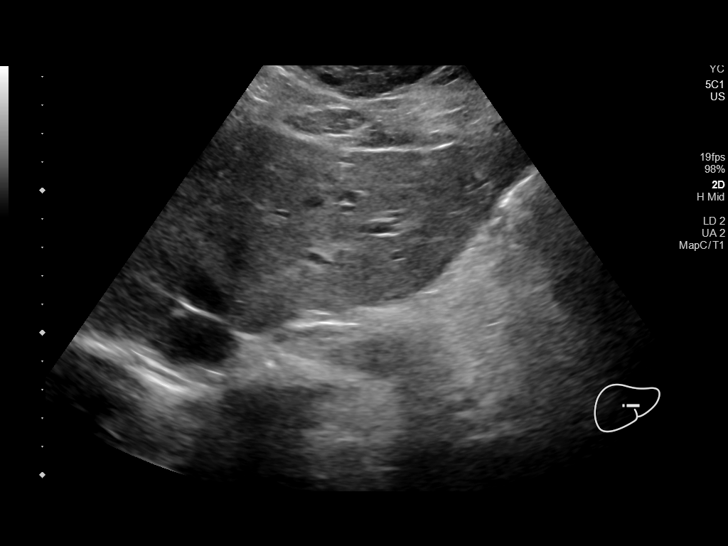
[im 56/75]
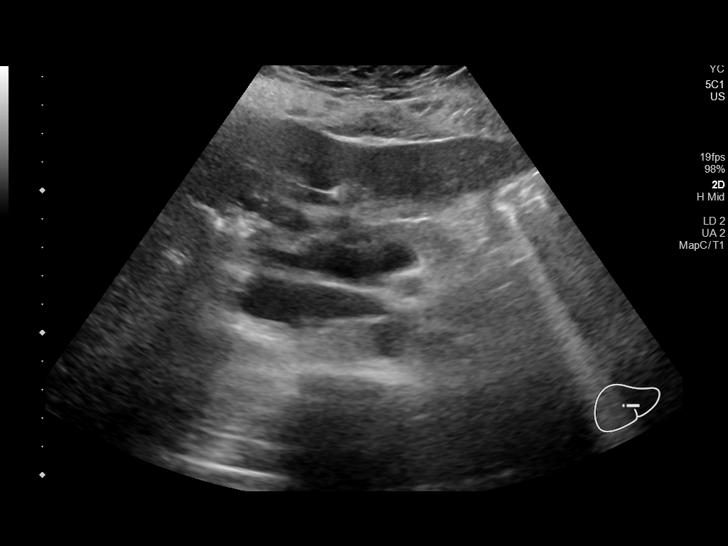
[im 62/75]
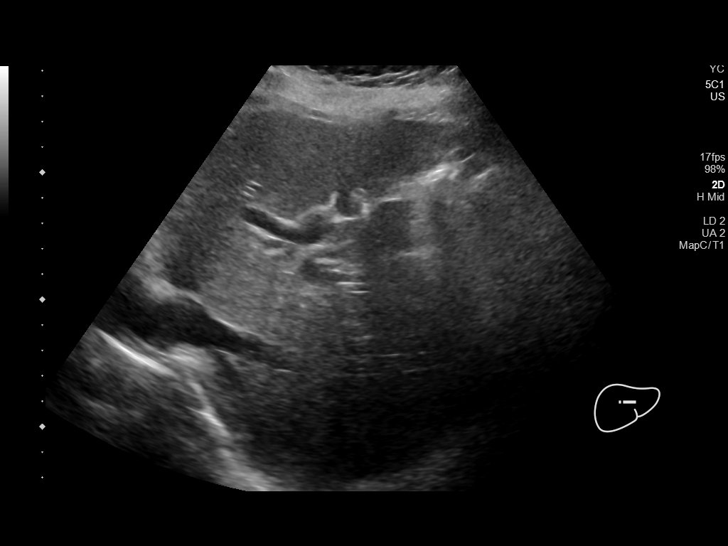
[im 68/75]
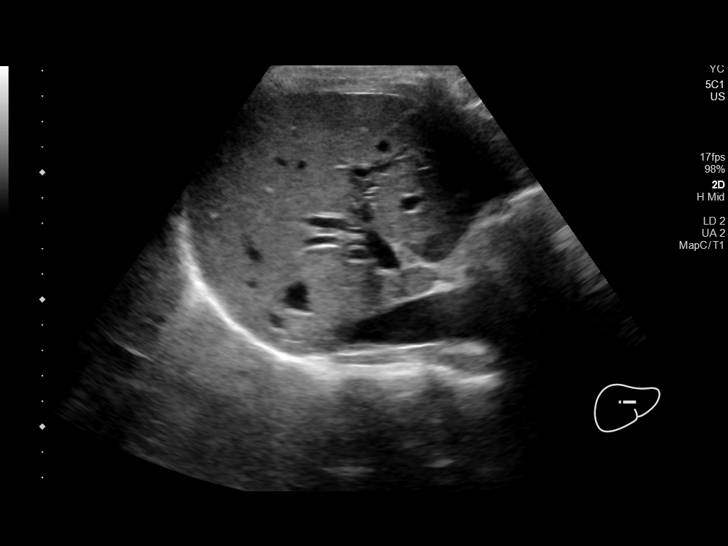
[im 75/75]
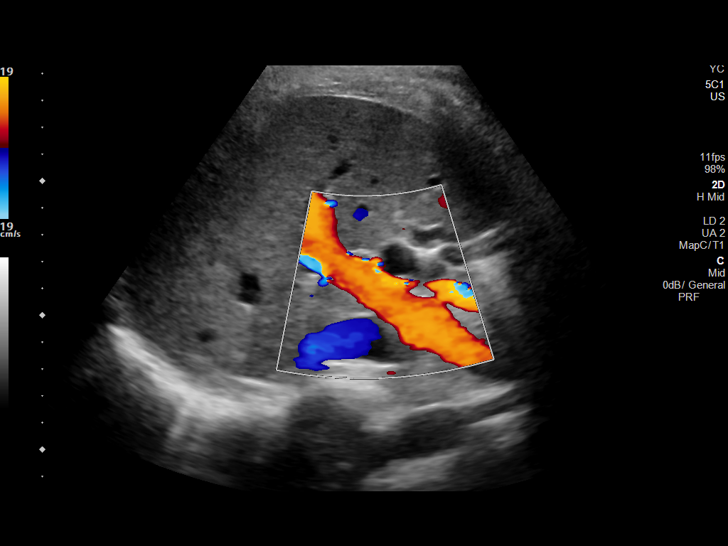

[14 of 25 positions shown; findings below may reference images not displayed]

FINDINGS: Gallbladder:

Status post cholecystectomy. Small cystic area within the
gallbladder measures 2.7 x 0.7 x 1.3 cm. No free fluid identified
within the gallbladder fossa.

Common bile duct:

Diameter: 13.7 mm

Liver:

No focal lesion identified. Within normal limits in parenchymal
echogenicity. Portal vein is patent on color Doppler imaging with
normal direction of blood flow towards the liver.

Other: None.
IMPRESSION: 1. Dilated common bile duct status post cholecystectomy. If there
are clinical signs or symptoms concerning for choledocholithiasis
consider further evaluation with MRCP.
2. Small cystic structure within the gallbladder fossa may represent
gallbladder or cystic duct remnant status post cholecystectomy.

## 2022-08-18 ENCOUNTER — Other Ambulatory Visit: Payer: Self-pay

## 2022-08-18 ENCOUNTER — Encounter (HOSPITAL_BASED_OUTPATIENT_CLINIC_OR_DEPARTMENT_OTHER): Payer: Self-pay | Admitting: Emergency Medicine

## 2022-08-18 ENCOUNTER — Emergency Department (HOSPITAL_BASED_OUTPATIENT_CLINIC_OR_DEPARTMENT_OTHER)
Admission: EM | Admit: 2022-08-18 | Discharge: 2022-08-18 | Disposition: A | Discharge status: Home / Self Care | Payer: Medicaid Other | Attending: Emergency Medicine | Admitting: Emergency Medicine

## 2022-08-18 DIAGNOSIS — R051 Acute cough: Secondary | ICD-10-CM

## 2022-08-18 DIAGNOSIS — U071 COVID-19: Secondary | ICD-10-CM | POA: Insufficient documentation

## 2022-08-18 DIAGNOSIS — R197 Diarrhea, unspecified: Secondary | ICD-10-CM

## 2022-08-18 DIAGNOSIS — R509 Fever, unspecified: Secondary | ICD-10-CM

## 2022-08-18 LAB — SARS CORONAVIRUS 2 BY RT PCR: SARS Coronavirus 2 by RT PCR: POSITIVE — AB

## 2022-08-18 NOTE — ED Triage Notes (Signed)
Pt arrives pov, steady gait, c/o fever, HA, cough, congestion, and diarrhea, ibuprofen pta. Exposure to Covid.

## 2022-08-18 NOTE — ED Provider Notes (Signed)
[facility] EMERGENCY DEPARTMENT Provider Note   CSN: [id number] Arrival date & time: 08/18/22  1249     History  Chief Complaint  Patient presents with   Cough    Jessica Kirk is a 36 y.o. female with no significant past medical history who presents the emergency department complaining of flulike symptoms for the past 2 days.  Patient states she has been having nasal congestion, runny nose, cough, diarrhea, and headache.  States her son recently tested positive for COVID.  She took some ibuprofen this morning and states that improved her headache.  Had 2 episodes of diarrhea starting this morning.  Denies seeing any blood.   Cough Associated symptoms: fever and rhinorrhea   Associated symptoms: no chest pain and no shortness of breath        Home Medications Prior to Admission medications   Medication Sig Start Date End Date Taking? Authorizing Provider  gabapentin (NEURONTIN) 300 MG capsule Take 900 mg by mouth daily.    [provider]  pantoprazole (PROTONIX) 40 MG tablet Take 1 tablet (40 mg total) by mouth daily. 05/08/21 05/08/22  Alwyn Ren, MD      Allergies    Patient has no known allergies.    Review of Systems   Review of Systems  Constitutional:  Positive for fever.  HENT:  Positive for congestion and rhinorrhea.   Respiratory:  Positive for cough. Negative for shortness of breath.   Cardiovascular:  Negative for chest pain.  Gastrointestinal:  Positive for diarrhea. Negative for abdominal pain, nausea and vomiting.  Genitourinary:  Negative for dysuria.  All other systems reviewed and are negative.   Physical Exam Updated Vital Signs BP (!) 146/123 (BP Location: Right Arm)   Pulse 74   Temp 98.9 F (37.2 C) (Oral)   Resp 18   Ht 5\' 4"  (1.626 m)   Wt 99.8 kg   LMP 07/21/2022   SpO2 100%   BMI 37.76 kg/m  Physical Exam Vitals and nursing note reviewed.  Constitutional:      Appearance: Normal appearance.  HENT:      Head: Normocephalic and atraumatic.     Nose: Congestion present.  Eyes:     Conjunctiva/sclera: Conjunctivae normal.  Cardiovascular:     Rate and Rhythm: Normal rate and regular rhythm.  Pulmonary:     Effort: Pulmonary effort is normal. No respiratory distress.     Breath sounds: Normal breath sounds.  Abdominal:     General: There is no distension.     Palpations: Abdomen is soft.     Tenderness: There is no abdominal tenderness.  Skin:    General: Skin is warm and dry.  Neurological:     General: No focal deficit present.     Mental Status: She is alert.     ED Results / Procedures / Treatments   Labs (all labs ordered are listed, but only abnormal results are displayed) Labs Reviewed  SARS CORONAVIRUS 2 BY RT PCR - Abnormal; Notable for the following components:      Result Value   SARS Coronavirus 2 by RT PCR POSITIVE (*)    All other components within normal limits    EKG None  Radiology No results found.  Procedures Procedures    Medications Ordered in ED Medications - No data to display  ED Course/ Medical Decision Making/ A&P  Medical Decision Making  Patient is an otherwise healthy 36 year old female presents the emergency department complaining of flulike symptoms for the past 2 days.  States son recently tested positive for COVID.  On exam patient is slightly hypertensive, otherwise normal vital signs.  Afebrile.  Lung sounds clear to auscultation.  Patient otherwise appears clinically well.  COVID test positive.  Discussed the results with the patient.  Feel as though she is stable for discharged home with symptomatic management.  Does not meet SIRS.  Do not believe she is requiring admission.  Recommended over-the-counter medications, and gave close return precautions.  Patient agreeable to the plan.        Final Clinical Impression(s) / ED Diagnoses Final diagnoses:  COVID-19  Fever, unspecified  Acute cough   Diarrhea, unspecified type    Rx / DC Orders ED Discharge Orders     None      Portions of this report may have been transcribed using voice recognition software. Every effort was made to ensure accuracy; however, inadvertent computerized transcription errors may be present.    Atlee Kluth T, PA-C 08/18/22 1506    Edwin Dada P, DO 08/21/22 1545

## 2022-08-18 NOTE — ED Notes (Addendum)
SON POSITIVE FOR COVID

## 2022-08-18 NOTE — Discharge Instructions (Addendum)
You were seen in the emergency department for cough and fever.  As we discussed, you tested positive for COVID. Symptoms can last for up to a week.  You can take ibuprofen or Tylenol for pain or fever, and I recommend alternating between the 2.  Make sure that you are drinking lots of fluids and getting plenty of rest. You can take decongestants as long as you take them with lots of water. You can use lozenges or chloraseptic spray as needed for sore throat.   Please use Tylenol or ibuprofen for pain.  You may use 600 mg ibuprofen every 6 hours or 1000 mg of Tylenol every 6 hours.  You may choose to alternate between the 2.  This would be most effective.  Do not exceed 4 g of Tylenol within 24 hours.  Do not exceed 3200 mg ibuprofen within 24 hours.  Continue to monitor how you are doing, and return to the emergency department for new or worsening symptoms such as chest pain, difficulty breathing not related to coughing, fever despite medication, or persistent vomiting or diarrhea.  It has been a pleasure taking care of you today and I hope you begin to feel better soon!
